# Patient Record
Sex: Female | Born: 1935 | Race: White | Hispanic: No
Health system: Southern US, Community
[De-identification: ages and names within clinical notes are randomized; demographics above are authoritative.]

---

## 2001-04-22 ENCOUNTER — Other Ambulatory Visit: Admission: RE | Admit: 2001-04-22 | Discharge: 2001-04-22 | Payer: Self-pay | Admitting: Family Medicine

## 2004-10-21 ENCOUNTER — Ambulatory Visit: Payer: Self-pay | Admitting: Internal Medicine

## 2004-10-22 ENCOUNTER — Ambulatory Visit: Payer: Self-pay | Admitting: Internal Medicine

## 2004-11-19 ENCOUNTER — Emergency Department: Payer: Self-pay | Admitting: Unknown Physician Specialty

## 2004-11-21 ENCOUNTER — Ambulatory Visit: Payer: Self-pay | Admitting: Unknown Physician Specialty

## 2007-04-06 ENCOUNTER — Ambulatory Visit: Payer: Self-pay | Admitting: Family Medicine

## 2007-11-07 ENCOUNTER — Ambulatory Visit: Payer: Self-pay | Admitting: Family Medicine

## 2009-02-13 ENCOUNTER — Ambulatory Visit: Payer: Self-pay | Admitting: Family Medicine

## 2009-09-16 ENCOUNTER — Ambulatory Visit: Payer: Self-pay | Admitting: Family Medicine

## 2009-11-02 ENCOUNTER — Emergency Department: Payer: Self-pay | Admitting: Emergency Medicine

## 2009-11-19 ENCOUNTER — Ambulatory Visit: Payer: Self-pay | Admitting: Family Medicine

## 2011-03-24 ENCOUNTER — Ambulatory Visit: Payer: Self-pay | Admitting: Ophthalmology

## 2011-03-31 ENCOUNTER — Ambulatory Visit: Payer: Self-pay | Admitting: Ophthalmology

## 2011-05-15 ENCOUNTER — Ambulatory Visit: Payer: Self-pay | Admitting: Ophthalmology

## 2011-05-27 ENCOUNTER — Ambulatory Visit: Payer: Self-pay | Admitting: Ophthalmology

## 2013-06-14 ENCOUNTER — Ambulatory Visit: Payer: Self-pay | Admitting: Otolaryngology

## 2013-06-23 ENCOUNTER — Ambulatory Visit: Payer: Self-pay | Admitting: Otolaryngology

## 2013-06-28 ENCOUNTER — Ambulatory Visit: Payer: Self-pay | Admitting: Oncology

## 2013-06-28 LAB — COMPREHENSIVE METABOLIC PANEL
Albumin: 3.1 g/dL — ABNORMAL LOW (ref 3.4–5.0)
Alkaline Phosphatase: 120 U/L (ref 50–136)
Anion Gap: 10 (ref 7–16)
Calcium, Total: 8.3 mg/dL — ABNORMAL LOW (ref 8.5–10.1)
Chloride: 103 mmol/L (ref 98–107)
Co2: 29 mmol/L (ref 21–32)
Glucose: 105 mg/dL — ABNORMAL HIGH (ref 65–99)
Osmolality: 290 (ref 275–301)
Sodium: 142 mmol/L (ref 136–145)
Total Protein: 6.9 g/dL (ref 6.4–8.2)

## 2013-06-28 LAB — CBC CANCER CENTER
Basophil #: 0.1 x10 3/mm (ref 0.0–0.1)
Eosinophil %: 0.8 %
HCT: 35.4 % (ref 35.0–47.0)
HGB: 11.8 g/dL — ABNORMAL LOW (ref 12.0–16.0)
Lymphocyte %: 11.6 %
MCH: 30 pg (ref 26.0–34.0)
Monocyte #: 0.8 x10 3/mm (ref 0.2–0.9)
Monocyte %: 8.7 %
Neutrophil %: 78 %
RBC: 3.93 10*6/uL (ref 3.80–5.20)
RDW: 13.5 % (ref 11.5–14.5)

## 2013-06-28 LAB — PROTIME-INR
INR: 0.9
Prothrombin Time: 12.4 secs (ref 11.5–14.7)

## 2013-06-28 LAB — APTT: Activated PTT: 38.3 secs — ABNORMAL HIGH (ref 23.6–35.9)

## 2013-07-03 ENCOUNTER — Ambulatory Visit: Payer: Self-pay | Admitting: Otolaryngology

## 2013-07-03 DIAGNOSIS — I10 Essential (primary) hypertension: Secondary | ICD-10-CM

## 2013-07-04 ENCOUNTER — Ambulatory Visit: Payer: Self-pay | Admitting: Otolaryngology

## 2013-07-15 ENCOUNTER — Ambulatory Visit: Payer: Self-pay | Admitting: Oncology

## 2013-07-18 LAB — CBC CANCER CENTER
Eosinophil %: 0.9 %
HCT: 34.1 % — ABNORMAL LOW (ref 35.0–47.0)
Lymphocyte #: 0.6 x10 3/mm — ABNORMAL LOW (ref 1.0–3.6)
MCHC: 31.8 g/dL — ABNORMAL LOW (ref 32.0–36.0)
Monocyte %: 8.3 %
Neutrophil %: 82.6 %
RBC: 3.8 10*6/uL (ref 3.80–5.20)
WBC: 8.3 x10 3/mm (ref 3.6–11.0)

## 2013-07-18 LAB — COMPREHENSIVE METABOLIC PANEL
Albumin: 2.5 g/dL — ABNORMAL LOW (ref 3.4–5.0)
Alkaline Phosphatase: 126 U/L (ref 50–136)
Anion Gap: 10 (ref 7–16)
BUN: 31 mg/dL — ABNORMAL HIGH (ref 7–18)
Co2: 26 mmol/L (ref 21–32)
Creatinine: 1.1 mg/dL (ref 0.60–1.30)
EGFR (African American): 53 — ABNORMAL LOW
EGFR (Non-African Amer.): 45 — ABNORMAL LOW
Potassium: 3.9 mmol/L (ref 3.5–5.1)
SGOT(AST): 23 U/L (ref 15–37)

## 2013-07-18 LAB — LACTATE DEHYDROGENASE: LDH: 308 U/L — ABNORMAL HIGH (ref 81–246)

## 2013-07-25 LAB — URINALYSIS, COMPLETE
Bilirubin,UR: NEGATIVE
Glucose,UR: NEGATIVE mg/dL (ref 0–75)
Leukocyte Esterase: NEGATIVE
Ph: 6 (ref 4.5–8.0)
RBC,UR: 17 /HPF (ref 0–5)
Specific Gravity: 1.015 (ref 1.003–1.030)
Squamous Epithelial: 1

## 2013-07-25 LAB — CBC CANCER CENTER
Bands: 16 %
Lymphocytes: 1 %
MCH: 28.2 pg (ref 26.0–34.0)
MCHC: 31.6 g/dL — ABNORMAL LOW (ref 32.0–36.0)
MCV: 89 fL (ref 80–100)
Monocytes: 8 %
Platelet: 328 x10 3/mm (ref 150–440)
RBC: 4.01 10*6/uL (ref 3.80–5.20)
RDW: 14.5 % (ref 11.5–14.5)
WBC: 40.3 x10 3/mm — ABNORMAL HIGH (ref 3.6–11.0)

## 2013-08-01 LAB — CBC CANCER CENTER
Basophil #: 0.2 x10 3/mm — ABNORMAL HIGH (ref 0.0–0.1)
HGB: 11.5 g/dL — ABNORMAL LOW (ref 12.0–16.0)
Lymphocyte #: 1.4 x10 3/mm (ref 1.0–3.6)
Lymphocyte %: 9.7 %
MCH: 28.7 pg (ref 26.0–34.0)
MCV: 90 fL (ref 80–100)
Monocyte #: 1.1 x10 3/mm — ABNORMAL HIGH (ref 0.2–0.9)
Monocyte %: 8.2 %
Neutrophil #: 11.2 x10 3/mm — ABNORMAL HIGH (ref 1.4–6.5)
Neutrophil %: 79.8 %
Platelet: 268 x10 3/mm (ref 150–440)
RDW: 15.8 % — ABNORMAL HIGH (ref 11.5–14.5)
WBC: 14.1 x10 3/mm — ABNORMAL HIGH (ref 3.6–11.0)

## 2013-08-08 LAB — CBC CANCER CENTER
Basophil #: 0.1 x10 3/mm (ref 0.0–0.1)
Eosinophil #: 0.1 x10 3/mm (ref 0.0–0.7)
HCT: 36 % (ref 35.0–47.0)
HGB: 11.5 g/dL — ABNORMAL LOW (ref 12.0–16.0)
Lymphocyte %: 15.5 %
MCH: 29.1 pg (ref 26.0–34.0)
MCHC: 32 g/dL (ref 32.0–36.0)
MCV: 91 fL (ref 80–100)
Monocyte #: 1 x10 3/mm — ABNORMAL HIGH (ref 0.2–0.9)
Platelet: 264 x10 3/mm (ref 150–440)
WBC: 8.2 x10 3/mm (ref 3.6–11.0)

## 2013-08-14 ENCOUNTER — Ambulatory Visit: Payer: Self-pay | Admitting: Oncology

## 2013-08-15 LAB — COMPREHENSIVE METABOLIC PANEL
Albumin: 3 g/dL — ABNORMAL LOW (ref 3.4–5.0)
Alkaline Phosphatase: 92 U/L
BUN: 21 mg/dL — ABNORMAL HIGH (ref 7–18)
Calcium, Total: 8.7 mg/dL (ref 8.5–10.1)
Creatinine: 0.91 mg/dL (ref 0.60–1.30)
EGFR (Non-African Amer.): 57 — ABNORMAL LOW
Glucose: 115 mg/dL — ABNORMAL HIGH (ref 65–99)
SGOT(AST): 32 U/L (ref 15–37)
Sodium: 141 mmol/L (ref 136–145)
Total Protein: 6.2 g/dL — ABNORMAL LOW (ref 6.4–8.2)

## 2013-08-15 LAB — CBC CANCER CENTER
Basophil #: 0.1 x10 3/mm (ref 0.0–0.1)
Basophil %: 1.4 %
Eosinophil #: 0.2 x10 3/mm (ref 0.0–0.7)
Eosinophil %: 3.5 %
HCT: 34.8 % — ABNORMAL LOW (ref 35.0–47.0)
Lymphocyte #: 1 x10 3/mm (ref 1.0–3.6)
Lymphocyte %: 16.5 %
MCH: 29.7 pg (ref 26.0–34.0)
MCHC: 32.7 g/dL (ref 32.0–36.0)
MCV: 91 fL (ref 80–100)
Monocyte %: 11.9 %
Neutrophil %: 66.7 %
Platelet: 192 x10 3/mm (ref 150–440)
RDW: 16.4 % — ABNORMAL HIGH (ref 11.5–14.5)

## 2013-09-12 LAB — COMPREHENSIVE METABOLIC PANEL
Albumin: 3.1 g/dL — ABNORMAL LOW (ref 3.4–5.0)
Alkaline Phosphatase: 118 U/L — ABNORMAL HIGH
BUN: 26 mg/dL — ABNORMAL HIGH (ref 7–18)
EGFR (African American): 60 — ABNORMAL LOW
EGFR (Non-African Amer.): 52 — ABNORMAL LOW
Glucose: 119 mg/dL — ABNORMAL HIGH (ref 65–99)
Osmolality: 289 (ref 275–301)
Potassium: 4.1 mmol/L (ref 3.5–5.1)
SGOT(AST): 44 U/L — ABNORMAL HIGH (ref 15–37)
SGPT (ALT): 28 U/L (ref 12–78)
Sodium: 142 mmol/L (ref 136–145)

## 2013-09-12 LAB — CBC CANCER CENTER
Basophil %: 1.4 %
Eosinophil #: 0.1 x10 3/mm (ref 0.0–0.7)
Eosinophil %: 2.4 %
HCT: 34.7 % — ABNORMAL LOW (ref 35.0–47.0)
Lymphocyte #: 0.5 x10 3/mm — ABNORMAL LOW (ref 1.0–3.6)
Lymphocyte %: 9.7 %
MCH: 29.6 pg (ref 26.0–34.0)
MCHC: 32.5 g/dL (ref 32.0–36.0)
MCV: 91 fL (ref 80–100)
Monocyte #: 0.8 x10 3/mm (ref 0.2–0.9)
Monocyte %: 14.6 %
Neutrophil #: 4 x10 3/mm (ref 1.4–6.5)
Neutrophil %: 71.9 %
Platelet: 256 x10 3/mm (ref 150–440)
RDW: 16.4 % — ABNORMAL HIGH (ref 11.5–14.5)

## 2013-09-14 ENCOUNTER — Ambulatory Visit: Payer: Self-pay | Admitting: Oncology

## 2013-10-10 LAB — CBC CANCER CENTER
Basophil #: 0.1 x10 3/mm (ref 0.0–0.1)
Basophil %: 1.8 %
Eosinophil #: 0.2 x10 3/mm (ref 0.0–0.7)
Eosinophil %: 4.6 %
HCT: 35.2 % (ref 35.0–47.0)
HGB: 11.4 g/dL — AB (ref 12.0–16.0)
Lymphocyte #: 0.4 x10 3/mm — ABNORMAL LOW (ref 1.0–3.6)
Lymphocyte %: 10.5 %
MCH: 29.4 pg (ref 26.0–34.0)
MCHC: 32.4 g/dL (ref 32.0–36.0)
MCV: 91 fL (ref 80–100)
MONOS PCT: 18.1 %
Monocyte #: 0.7 x10 3/mm (ref 0.2–0.9)
NEUTROS ABS: 2.5 x10 3/mm (ref 1.4–6.5)
Neutrophil %: 65 %
Platelet: 198 x10 3/mm (ref 150–440)
RBC: 3.88 10*6/uL (ref 3.80–5.20)
RDW: 15.1 % — AB (ref 11.5–14.5)
WBC: 3.9 x10 3/mm (ref 3.6–11.0)

## 2013-10-10 LAB — COMPREHENSIVE METABOLIC PANEL
ALT: 22 U/L (ref 12–78)
Albumin: 2.9 g/dL — ABNORMAL LOW (ref 3.4–5.0)
Alkaline Phosphatase: 123 U/L — ABNORMAL HIGH
Anion Gap: 9 (ref 7–16)
BUN: 19 mg/dL — AB (ref 7–18)
Bilirubin,Total: 0.2 mg/dL (ref 0.2–1.0)
CHLORIDE: 105 mmol/L (ref 98–107)
CREATININE: 0.95 mg/dL (ref 0.60–1.30)
Calcium, Total: 7.8 mg/dL — ABNORMAL LOW (ref 8.5–10.1)
Co2: 28 mmol/L (ref 21–32)
EGFR (African American): >60
EGFR (Non-African Amer.): 54 — ABNORMAL LOW
GLUCOSE: 159 mg/dL — AB (ref 65–99)
OSMOLALITY: 289 (ref 275–301)
Potassium: 3.7 mmol/L (ref 3.5–5.1)
SGOT(AST): 39 U/L — ABNORMAL HIGH (ref 15–37)
Sodium: 142 mmol/L (ref 136–145)
TOTAL PROTEIN: 5.8 g/dL — AB (ref 6.4–8.2)

## 2013-10-15 ENCOUNTER — Ambulatory Visit: Payer: Self-pay | Admitting: Oncology

## 2013-11-12 ENCOUNTER — Ambulatory Visit: Payer: Self-pay | Admitting: Oncology

## 2013-11-14 LAB — COMPREHENSIVE METABOLIC PANEL
Albumin: 3.2 g/dL — ABNORMAL LOW (ref 3.4–5.0)
Alkaline Phosphatase: 110 U/L
Anion Gap: 9 (ref 7–16)
BUN: 26 mg/dL — ABNORMAL HIGH (ref 7–18)
Bilirubin,Total: 0.3 mg/dL (ref 0.2–1.0)
CHLORIDE: 103 mmol/L (ref 98–107)
Calcium, Total: 8.3 mg/dL — ABNORMAL LOW (ref 8.5–10.1)
Co2: 30 mmol/L (ref 21–32)
Creatinine: 0.92 mg/dL (ref 0.60–1.30)
EGFR (Non-African Amer.): 56 — ABNORMAL LOW
Glucose: 97 mg/dL (ref 65–99)
OSMOLALITY: 288 (ref 275–301)
POTASSIUM: 3.9 mmol/L (ref 3.5–5.1)
SGOT(AST): 32 U/L (ref 15–37)
SGPT (ALT): 18 U/L (ref 12–78)
Sodium: 142 mmol/L (ref 136–145)
TOTAL PROTEIN: 6.2 g/dL — AB (ref 6.4–8.2)

## 2013-11-14 LAB — CBC CANCER CENTER
Basophil #: 0.1 x10 3/mm (ref 0.0–0.1)
Basophil %: 1 %
Eosinophil #: 0.2 x10 3/mm (ref 0.0–0.7)
Eosinophil %: 3.5 %
HCT: 36.5 % (ref 35.0–47.0)
HGB: 11.8 g/dL — AB (ref 12.0–16.0)
Lymphocyte #: 0.6 x10 3/mm — ABNORMAL LOW (ref 1.0–3.6)
Lymphocyte %: 9.7 %
MCH: 29.3 pg (ref 26.0–34.0)
MCHC: 32.5 g/dL (ref 32.0–36.0)
MCV: 90 fL (ref 80–100)
MONOS PCT: 12.6 %
Monocyte #: 0.7 x10 3/mm (ref 0.2–0.9)
NEUTROS ABS: 4.2 x10 3/mm (ref 1.4–6.5)
NEUTROS PCT: 73.2 %
Platelet: 208 x10 3/mm (ref 150–440)
RBC: 4.04 10*6/uL (ref 3.80–5.20)
RDW: 14.1 % (ref 11.5–14.5)
WBC: 5.7 x10 3/mm (ref 3.6–11.0)

## 2013-11-21 LAB — CBC CANCER CENTER
BANDS NEUTROPHIL: 19 %
COMMENT - H1-COM3: NORMAL
Eosinophil: 1 %
HCT: 34.7 % — ABNORMAL LOW (ref 35.0–47.0)
HGB: 10.9 g/dL — ABNORMAL LOW (ref 12.0–16.0)
MCH: 28.6 pg (ref 26.0–34.0)
MCHC: 31.4 g/dL — ABNORMAL LOW (ref 32.0–36.0)
MCV: 91 fL (ref 80–100)
MONOS PCT: 7 %
Platelet: 170 x10 3/mm (ref 150–440)
RBC: 3.81 10*6/uL (ref 3.80–5.20)
RDW: 14.3 % (ref 11.5–14.5)
SEGMENTED NEUTROPHILS: 73 %
WBC: 21.8 x10 3/mm — ABNORMAL HIGH (ref 3.6–11.0)

## 2013-11-28 LAB — CBC CANCER CENTER
Basophil #: 0 x10 3/mm (ref 0.0–0.1)
Basophil %: 0.5 %
EOS ABS: 0.1 x10 3/mm (ref 0.0–0.7)
Eosinophil %: 1.4 %
HCT: 35 % (ref 35.0–47.0)
HGB: 11.4 g/dL — ABNORMAL LOW (ref 12.0–16.0)
Lymphocyte #: 0.3 x10 3/mm — ABNORMAL LOW (ref 1.0–3.6)
Lymphocyte %: 2.9 %
MCH: 29.4 pg (ref 26.0–34.0)
MCHC: 32.4 g/dL (ref 32.0–36.0)
MCV: 91 fL (ref 80–100)
MONO ABS: 0.9 x10 3/mm (ref 0.2–0.9)
MONOS PCT: 9 %
NEUTROS PCT: 86.2 %
Neutrophil #: 9 x10 3/mm — ABNORMAL HIGH (ref 1.4–6.5)
PLATELETS: 267 x10 3/mm (ref 150–440)
RBC: 3.86 10*6/uL (ref 3.80–5.20)
RDW: 14.7 % — AB (ref 11.5–14.5)
WBC: 10.4 x10 3/mm (ref 3.6–11.0)

## 2013-12-05 LAB — CBC CANCER CENTER
BASOS ABS: 0.1 x10 3/mm (ref 0.0–0.1)
Basophil %: 0.8 %
Eosinophil #: 0.1 x10 3/mm (ref 0.0–0.7)
Eosinophil %: 2.1 %
HCT: 36.8 % (ref 35.0–47.0)
HGB: 11.8 g/dL — AB (ref 12.0–16.0)
LYMPHS PCT: 8.3 %
Lymphocyte #: 0.5 x10 3/mm — ABNORMAL LOW (ref 1.0–3.6)
MCH: 29.2 pg (ref 26.0–34.0)
MCHC: 32.1 g/dL (ref 32.0–36.0)
MCV: 91 fL (ref 80–100)
Monocyte #: 1.2 x10 3/mm — ABNORMAL HIGH (ref 0.2–0.9)
Monocyte %: 18.9 %
NEUTROS ABS: 4.6 x10 3/mm (ref 1.4–6.5)
Neutrophil %: 69.9 %
PLATELETS: 262 x10 3/mm (ref 150–440)
RBC: 4.06 10*6/uL (ref 3.80–5.20)
RDW: 14.9 % — ABNORMAL HIGH (ref 11.5–14.5)
WBC: 6.5 x10 3/mm (ref 3.6–11.0)

## 2013-12-12 LAB — CBC CANCER CENTER
Basophil #: 0.1 x10 3/mm (ref 0.0–0.1)
Basophil %: 1 %
EOS ABS: 0.2 x10 3/mm (ref 0.0–0.7)
Eosinophil %: 2.7 %
HCT: 35.5 % (ref 35.0–47.0)
HGB: 11.3 g/dL — ABNORMAL LOW (ref 12.0–16.0)
LYMPHS ABS: 0.8 x10 3/mm — AB (ref 1.0–3.6)
Lymphocyte %: 13.2 %
MCH: 28.9 pg (ref 26.0–34.0)
MCHC: 31.8 g/dL — AB (ref 32.0–36.0)
MCV: 91 fL (ref 80–100)
MONO ABS: 0.7 x10 3/mm (ref 0.2–0.9)
Monocyte %: 12.3 %
NEUTROS PCT: 70.8 %
Neutrophil #: 4.1 x10 3/mm (ref 1.4–6.5)
PLATELETS: 217 x10 3/mm (ref 150–440)
RBC: 3.91 10*6/uL (ref 3.80–5.20)
RDW: 14.7 % — AB (ref 11.5–14.5)
WBC: 5.7 x10 3/mm (ref 3.6–11.0)

## 2013-12-12 LAB — COMPREHENSIVE METABOLIC PANEL
ANION GAP: 10 (ref 7–16)
AST: 36 U/L (ref 15–37)
Albumin: 3.2 g/dL — ABNORMAL LOW (ref 3.4–5.0)
Alkaline Phosphatase: 129 U/L — ABNORMAL HIGH
BUN: 23 mg/dL — AB (ref 7–18)
Bilirubin,Total: 0.3 mg/dL (ref 0.2–1.0)
CHLORIDE: 106 mmol/L (ref 98–107)
Calcium, Total: 8.8 mg/dL (ref 8.5–10.1)
Co2: 28 mmol/L (ref 21–32)
Creatinine: 0.97 mg/dL (ref 0.60–1.30)
EGFR (African American): >60
EGFR (Non-African Amer.): 53 — ABNORMAL LOW
Glucose: 111 mg/dL — ABNORMAL HIGH (ref 65–99)
Osmolality: 291 (ref 275–301)
Potassium: 3.9 mmol/L (ref 3.5–5.1)
SGPT (ALT): 15 U/L (ref 12–78)
Sodium: 144 mmol/L (ref 136–145)
TOTAL PROTEIN: 6.2 g/dL — AB (ref 6.4–8.2)

## 2013-12-13 ENCOUNTER — Ambulatory Visit: Payer: Self-pay | Admitting: Oncology

## 2014-01-02 LAB — COMPREHENSIVE METABOLIC PANEL
ALBUMIN: 3.1 g/dL — AB (ref 3.4–5.0)
Alkaline Phosphatase: 176 U/L — ABNORMAL HIGH
Anion Gap: 4 — ABNORMAL LOW (ref 7–16)
BUN: 21 mg/dL — ABNORMAL HIGH (ref 7–18)
Bilirubin,Total: 0.3 mg/dL (ref 0.2–1.0)
Calcium, Total: 8.4 mg/dL — ABNORMAL LOW (ref 8.5–10.1)
Chloride: 107 mmol/L (ref 98–107)
Co2: 30 mmol/L (ref 21–32)
Creatinine: 0.8 mg/dL (ref 0.60–1.30)
EGFR (Non-African Amer.): >60
GLUCOSE: 115 mg/dL — AB (ref 65–99)
OSMOLALITY: 285 (ref 275–301)
Potassium: 3.7 mmol/L (ref 3.5–5.1)
SGOT(AST): 42 U/L — ABNORMAL HIGH (ref 15–37)
SGPT (ALT): 16 U/L (ref 12–78)
Sodium: 141 mmol/L (ref 136–145)
Total Protein: 6.3 g/dL — ABNORMAL LOW (ref 6.4–8.2)

## 2014-01-02 LAB — CBC CANCER CENTER
Basophil #: 0.1 x10 3/mm (ref 0.0–0.1)
Basophil %: 1.1 %
EOS ABS: 0.1 x10 3/mm (ref 0.0–0.7)
EOS PCT: 1.8 %
HCT: 35.3 % (ref 35.0–47.0)
HGB: 11.6 g/dL — ABNORMAL LOW (ref 12.0–16.0)
Lymphocyte #: 0.3 x10 3/mm — ABNORMAL LOW (ref 1.0–3.6)
Lymphocyte %: 5.3 %
MCH: 30.2 pg (ref 26.0–34.0)
MCHC: 32.8 g/dL (ref 32.0–36.0)
MCV: 92 fL (ref 80–100)
MONO ABS: 0.8 x10 3/mm (ref 0.2–0.9)
MONOS PCT: 13.7 %
NEUTROS ABS: 4.8 x10 3/mm (ref 1.4–6.5)
NEUTROS PCT: 78.1 %
PLATELETS: 194 x10 3/mm (ref 150–440)
RBC: 3.84 10*6/uL (ref 3.80–5.20)
RDW: 14.7 % — ABNORMAL HIGH (ref 11.5–14.5)
WBC: 6.1 x10 3/mm (ref 3.6–11.0)

## 2014-01-12 ENCOUNTER — Ambulatory Visit: Payer: Self-pay | Admitting: Oncology

## 2014-01-30 LAB — COMPREHENSIVE METABOLIC PANEL
ALBUMIN: 3.2 g/dL — AB (ref 3.4–5.0)
Alkaline Phosphatase: 165 U/L — ABNORMAL HIGH
Anion Gap: 7 (ref 7–16)
BUN: 19 mg/dL — ABNORMAL HIGH (ref 7–18)
Bilirubin,Total: 0.3 mg/dL (ref 0.2–1.0)
CREATININE: 0.91 mg/dL (ref 0.60–1.30)
Calcium, Total: 8.8 mg/dL (ref 8.5–10.1)
Chloride: 108 mmol/L — ABNORMAL HIGH (ref 98–107)
Co2: 31 mmol/L (ref 21–32)
EGFR (Non-African Amer.): 57 — ABNORMAL LOW
Glucose: 99 mg/dL (ref 65–99)
OSMOLALITY: 293 (ref 275–301)
Potassium: 4 mmol/L (ref 3.5–5.1)
SGOT(AST): 40 U/L — ABNORMAL HIGH (ref 15–37)
SGPT (ALT): 17 U/L (ref 12–78)
Sodium: 146 mmol/L — ABNORMAL HIGH (ref 136–145)
Total Protein: 6.2 g/dL — ABNORMAL LOW (ref 6.4–8.2)

## 2014-01-30 LAB — CBC CANCER CENTER
BASOS ABS: 0 x10 3/mm (ref 0.0–0.1)
BASOS PCT: 0.9 %
Eosinophil #: 0.2 x10 3/mm (ref 0.0–0.7)
Eosinophil %: 4.2 %
HCT: 32.2 % — ABNORMAL LOW (ref 35.0–47.0)
HGB: 10.8 g/dL — ABNORMAL LOW (ref 12.0–16.0)
Lymphocyte #: 0.4 x10 3/mm — ABNORMAL LOW (ref 1.0–3.6)
Lymphocyte %: 6.8 %
MCH: 30.6 pg (ref 26.0–34.0)
MCHC: 33.6 g/dL (ref 32.0–36.0)
MCV: 91 fL (ref 80–100)
Monocyte #: 0.6 x10 3/mm (ref 0.2–0.9)
Monocyte %: 12.3 %
NEUTROS ABS: 4 x10 3/mm (ref 1.4–6.5)
NEUTROS PCT: 75.8 %
Platelet: 162 x10 3/mm (ref 150–440)
RBC: 3.54 10*6/uL — AB (ref 3.80–5.20)
RDW: 15.2 % — AB (ref 11.5–14.5)
WBC: 5.3 x10 3/mm (ref 3.6–11.0)

## 2014-02-12 ENCOUNTER — Ambulatory Visit: Payer: Self-pay | Admitting: Oncology

## 2014-02-15 ENCOUNTER — Ambulatory Visit: Payer: Self-pay | Admitting: Family Medicine

## 2014-03-07 ENCOUNTER — Ambulatory Visit: Payer: Self-pay | Admitting: Oncology

## 2014-03-08 ENCOUNTER — Ambulatory Visit: Payer: Self-pay | Admitting: Oncology

## 2014-03-08 LAB — COMPREHENSIVE METABOLIC PANEL
ALBUMIN: 3.1 g/dL — AB (ref 3.4–5.0)
ANION GAP: 5 — AB (ref 7–16)
Alkaline Phosphatase: 164 U/L — ABNORMAL HIGH
BUN: 23 mg/dL — AB (ref 7–18)
Bilirubin,Total: 0.2 mg/dL (ref 0.2–1.0)
CALCIUM: 8.7 mg/dL (ref 8.5–10.1)
CHLORIDE: 105 mmol/L (ref 98–107)
CO2: 33 mmol/L — AB (ref 21–32)
Creatinine: 0.92 mg/dL (ref 0.60–1.30)
EGFR (African American): >60
EGFR (Non-African Amer.): 56 — ABNORMAL LOW
Glucose: 87 mg/dL (ref 65–99)
Osmolality: 288 (ref 275–301)
POTASSIUM: 4.1 mmol/L (ref 3.5–5.1)
SGOT(AST): 36 U/L (ref 15–37)
SGPT (ALT): 15 U/L (ref 12–78)
SODIUM: 143 mmol/L (ref 136–145)
Total Protein: 6.6 g/dL (ref 6.4–8.2)

## 2014-03-08 LAB — CBC CANCER CENTER
Basophil #: 0.1 x10 3/mm (ref 0.0–0.1)
Basophil %: 1.8 %
EOS PCT: 4.1 %
Eosinophil #: 0.2 x10 3/mm (ref 0.0–0.7)
HCT: 32.6 % — AB (ref 35.0–47.0)
HGB: 10.8 g/dL — ABNORMAL LOW (ref 12.0–16.0)
Lymphocyte #: 0.3 x10 3/mm — ABNORMAL LOW (ref 1.0–3.6)
Lymphocyte %: 7.1 %
MCH: 30.9 pg (ref 26.0–34.0)
MCHC: 33.1 g/dL (ref 32.0–36.0)
MCV: 93 fL (ref 80–100)
Monocyte #: 0.4 x10 3/mm (ref 0.2–0.9)
Monocyte %: 10.4 %
Neutrophil #: 3.3 x10 3/mm (ref 1.4–6.5)
Neutrophil %: 76.6 %
Platelet: 212 x10 3/mm (ref 150–440)
RBC: 3.49 10*6/uL — ABNORMAL LOW (ref 3.80–5.20)
RDW: 14.5 % (ref 11.5–14.5)
WBC: 4.3 x10 3/mm (ref 3.6–11.0)

## 2014-03-08 LAB — LACTATE DEHYDROGENASE: LDH: 391 U/L — AB (ref 81–246)

## 2014-03-14 ENCOUNTER — Ambulatory Visit: Payer: Self-pay | Admitting: Oncology

## 2014-03-20 ENCOUNTER — Inpatient Hospital Stay: Payer: Self-pay | Admitting: Family Medicine

## 2014-03-20 LAB — CBC
HCT: 32.1 % — AB (ref 35.0–47.0)
HGB: 10.7 g/dL — AB (ref 12.0–16.0)
MCH: 31.7 pg (ref 26.0–34.0)
MCHC: 33.2 g/dL (ref 32.0–36.0)
MCV: 95 fL (ref 80–100)
Platelet: 172 10*3/uL (ref 150–440)
RBC: 3.37 10*6/uL — AB (ref 3.80–5.20)
RDW: 14.8 % — AB (ref 11.5–14.5)
WBC: 4.9 10*3/uL (ref 3.6–11.0)

## 2014-03-20 LAB — COMPREHENSIVE METABOLIC PANEL
ALK PHOS: 139 U/L — AB
ALT: 15 U/L (ref 12–78)
ANION GAP: 7 (ref 7–16)
Albumin: 3.2 g/dL — ABNORMAL LOW (ref 3.4–5.0)
BUN: 16 mg/dL (ref 7–18)
Bilirubin,Total: 0.4 mg/dL (ref 0.2–1.0)
CO2: 28 mmol/L (ref 21–32)
CREATININE: 1.01 mg/dL (ref 0.60–1.30)
Calcium, Total: 8.6 mg/dL (ref 8.5–10.1)
Chloride: 99 mmol/L (ref 98–107)
EGFR (Non-African Amer.): 50 — ABNORMAL LOW
GFR CALC AF AMER: 58 — AB
GLUCOSE: 95 mg/dL (ref 65–99)
OSMOLALITY: 269 (ref 275–301)
Potassium: 3.7 mmol/L (ref 3.5–5.1)
SGOT(AST): 35 U/L (ref 15–37)
Sodium: 134 mmol/L — ABNORMAL LOW (ref 136–145)
TOTAL PROTEIN: 6.6 g/dL (ref 6.4–8.2)

## 2014-03-20 LAB — TROPONIN I: Troponin-I: <0.02 ng/mL

## 2014-03-21 ENCOUNTER — Ambulatory Visit: Payer: Self-pay | Admitting: Neurology

## 2014-03-21 DIAGNOSIS — I519 Heart disease, unspecified: Secondary | ICD-10-CM

## 2014-03-21 LAB — URINALYSIS, COMPLETE
BILIRUBIN, UR: NEGATIVE
GLUCOSE, UR: NEGATIVE mg/dL (ref 0–75)
Leukocyte Esterase: NEGATIVE
Nitrite: POSITIVE
Ph: 7 (ref 4.5–8.0)
Protein: NEGATIVE
RBC,UR: 5 /HPF (ref 0–5)
Specific Gravity: 1.005 (ref 1.003–1.030)
Squamous Epithelial: 1
WBC UR: 1 /HPF (ref 0–5)

## 2014-03-21 LAB — LIPID PANEL
CHOLESTEROL: 207 mg/dL — AB (ref 0–200)
HDL Cholesterol: 60 mg/dL (ref 40–60)
Ldl Cholesterol, Calc: 126 mg/dL — ABNORMAL HIGH (ref 0–100)
Triglycerides: 104 mg/dL (ref 0–200)
VLDL Cholesterol, Calc: 21 mg/dL (ref 5–40)

## 2014-03-22 LAB — BASIC METABOLIC PANEL
Anion Gap: 8 (ref 7–16)
BUN: 21 mg/dL — ABNORMAL HIGH (ref 7–18)
CREATININE: 0.98 mg/dL (ref 0.60–1.30)
Calcium, Total: 8.7 mg/dL (ref 8.5–10.1)
Chloride: 103 mmol/L (ref 98–107)
Co2: 28 mmol/L (ref 21–32)
EGFR (Non-African Amer.): 52 — ABNORMAL LOW
Glucose: 107 mg/dL — ABNORMAL HIGH (ref 65–99)
OSMOLALITY: 281 (ref 275–301)
Potassium: 3.7 mmol/L (ref 3.5–5.1)
SODIUM: 139 mmol/L (ref 136–145)

## 2014-03-23 ENCOUNTER — Ambulatory Visit: Payer: Self-pay | Admitting: Oncology

## 2014-04-10 ENCOUNTER — Inpatient Hospital Stay: Payer: Self-pay | Admitting: Oncology

## 2014-04-10 LAB — URINALYSIS, COMPLETE
Bacteria: NONE SEEN
Bilirubin,UR: NEGATIVE
Glucose,UR: NEGATIVE mg/dL (ref 0–75)
Nitrite: POSITIVE
PH: 6 (ref 4.5–8.0)
Protein: 30
RBC,UR: 6 /HPF (ref 0–5)
Specific Gravity: 1.015 (ref 1.003–1.030)
Squamous Epithelial: NONE SEEN
WBC UR: 73 /HPF (ref 0–5)

## 2014-04-10 LAB — COMPREHENSIVE METABOLIC PANEL
Albumin: 3.2 g/dL — ABNORMAL LOW (ref 3.4–5.0)
Alkaline Phosphatase: 107 U/L
Anion Gap: 9 (ref 7–16)
BUN: 26 mg/dL — AB (ref 7–18)
Bilirubin,Total: 0.6 mg/dL (ref 0.2–1.0)
CHLORIDE: 99 mmol/L (ref 98–107)
Calcium, Total: 8.8 mg/dL (ref 8.5–10.1)
Co2: 28 mmol/L (ref 21–32)
Creatinine: 0.76 mg/dL (ref 0.60–1.30)
EGFR (African American): >60
Glucose: 115 mg/dL — ABNORMAL HIGH (ref 65–99)
OSMOLALITY: 278 (ref 275–301)
Potassium: 3.5 mmol/L (ref 3.5–5.1)
SGOT(AST): 27 U/L (ref 15–37)
SGPT (ALT): 12 U/L — ABNORMAL LOW
Sodium: 136 mmol/L (ref 136–145)
Total Protein: 6.2 g/dL — ABNORMAL LOW (ref 6.4–8.2)

## 2014-04-10 LAB — CBC WITH DIFFERENTIAL/PLATELET
BASOS ABS: 0 10*3/uL (ref 0.0–0.1)
BASOS PCT: 0.5 %
Eosinophil #: 0 10*3/uL (ref 0.0–0.7)
Eosinophil %: 0.6 %
HCT: 32.2 % — ABNORMAL LOW (ref 35.0–47.0)
HGB: 10.7 g/dL — AB (ref 12.0–16.0)
Lymphocyte #: 0.2 10*3/uL — ABNORMAL LOW (ref 1.0–3.6)
Lymphocyte %: 3.9 %
MCH: 31.7 pg (ref 26.0–34.0)
MCHC: 33.1 g/dL (ref 32.0–36.0)
MCV: 96 fL (ref 80–100)
MONO ABS: 0.6 x10 3/mm (ref 0.2–0.9)
MONOS PCT: 11 %
Neutrophil #: 4.3 10*3/uL (ref 1.4–6.5)
Neutrophil %: 84 %
Platelet: 186 10*3/uL (ref 150–440)
RBC: 3.36 10*6/uL — AB (ref 3.80–5.20)
RDW: 13.9 % (ref 11.5–14.5)
WBC: 5.1 10*3/uL (ref 3.6–11.0)

## 2014-04-11 LAB — COMPREHENSIVE METABOLIC PANEL
Albumin: 2.9 g/dL — ABNORMAL LOW (ref 3.4–5.0)
Alkaline Phosphatase: 101 U/L
Anion Gap: 7 (ref 7–16)
BILIRUBIN TOTAL: 0.4 mg/dL (ref 0.2–1.0)
BUN: 30 mg/dL — ABNORMAL HIGH (ref 7–18)
Calcium, Total: 8.1 mg/dL — ABNORMAL LOW (ref 8.5–10.1)
Chloride: 103 mmol/L (ref 98–107)
Co2: 30 mmol/L (ref 21–32)
Creatinine: 0.85 mg/dL (ref 0.60–1.30)
EGFR (African American): >60
EGFR (Non-African Amer.): >60
Glucose: 145 mg/dL — ABNORMAL HIGH (ref 65–99)
Osmolality: 288 (ref 275–301)
POTASSIUM: 3.4 mmol/L — AB (ref 3.5–5.1)
SGOT(AST): 26 U/L (ref 15–37)
SGPT (ALT): 12 U/L — ABNORMAL LOW
SODIUM: 140 mmol/L (ref 136–145)
Total Protein: 6.1 g/dL — ABNORMAL LOW (ref 6.4–8.2)

## 2014-04-11 LAB — CBC WITH DIFFERENTIAL/PLATELET
BASOS PCT: 0.3 %
Basophil #: 0 10*3/uL (ref 0.0–0.1)
EOS ABS: 0 10*3/uL (ref 0.0–0.7)
Eosinophil %: 0 %
HCT: 32.1 % — ABNORMAL LOW (ref 35.0–47.0)
HGB: 10.7 g/dL — ABNORMAL LOW (ref 12.0–16.0)
LYMPHS ABS: 0.2 10*3/uL — AB (ref 1.0–3.6)
Lymphocyte %: 4.6 %
MCH: 31.9 pg (ref 26.0–34.0)
MCHC: 33.4 g/dL (ref 32.0–36.0)
MCV: 96 fL (ref 80–100)
Monocyte #: 0.1 x10 3/mm — ABNORMAL LOW (ref 0.2–0.9)
Monocyte %: 1.7 %
Neutrophil #: 4.4 10*3/uL (ref 1.4–6.5)
Neutrophil %: 93.4 %
PLATELETS: 176 10*3/uL (ref 150–440)
RBC: 3.36 10*6/uL — ABNORMAL LOW (ref 3.80–5.20)
RDW: 14.1 % (ref 11.5–14.5)
WBC: 4.7 10*3/uL (ref 3.6–11.0)

## 2014-04-11 LAB — MAGNESIUM: Magnesium: 1 mg/dL — ABNORMAL LOW

## 2014-04-12 LAB — BASIC METABOLIC PANEL
ANION GAP: 8 (ref 7–16)
BUN: 37 mg/dL — AB (ref 7–18)
CO2: 26 mmol/L (ref 21–32)
CREATININE: 1.06 mg/dL (ref 0.60–1.30)
Calcium, Total: 8.3 mg/dL — ABNORMAL LOW (ref 8.5–10.1)
Chloride: 105 mmol/L (ref 98–107)
EGFR (African American): 55 — ABNORMAL LOW
EGFR (Non-African Amer.): 48 — ABNORMAL LOW
Glucose: 158 mg/dL — ABNORMAL HIGH (ref 65–99)
OSMOLALITY: 290 (ref 275–301)
Potassium: 3.7 mmol/L (ref 3.5–5.1)
SODIUM: 139 mmol/L (ref 136–145)

## 2014-04-12 LAB — MAGNESIUM: Magnesium: 2.7 mg/dL — ABNORMAL HIGH

## 2014-04-14 ENCOUNTER — Ambulatory Visit: Payer: Self-pay | Admitting: Oncology

## 2014-04-16 ENCOUNTER — Ambulatory Visit: Payer: Self-pay | Admitting: Oncology

## 2014-05-02 ENCOUNTER — Observation Stay: Payer: Self-pay | Admitting: Internal Medicine

## 2014-05-02 LAB — CBC WITH DIFFERENTIAL/PLATELET
BASOS ABS: 0.1 10*3/uL (ref 0.0–0.1)
BASOS PCT: 1 %
EOS ABS: 0 10*3/uL (ref 0.0–0.7)
Eosinophil %: 0.4 %
HCT: 36 % (ref 35.0–47.0)
HGB: 12.3 g/dL (ref 12.0–16.0)
LYMPHS PCT: 1.4 %
Lymphocyte #: 0.1 10*3/uL — ABNORMAL LOW (ref 1.0–3.6)
MCH: 32.6 pg (ref 26.0–34.0)
MCHC: 34.1 g/dL (ref 32.0–36.0)
MCV: 96 fL (ref 80–100)
Monocyte #: 0.5 x10 3/mm (ref 0.2–0.9)
Monocyte %: 5.9 %
NEUTROS ABS: 8.5 10*3/uL — AB (ref 1.4–6.5)
Neutrophil %: 91.3 %
Platelet: 153 10*3/uL (ref 150–440)
RBC: 3.76 10*6/uL — AB (ref 3.80–5.20)
RDW: 14.5 % (ref 11.5–14.5)
WBC: 9 10*3/uL (ref 3.6–11.0)

## 2014-05-02 LAB — COMPREHENSIVE METABOLIC PANEL
ALBUMIN: 2.5 g/dL — AB (ref 3.4–5.0)
Alkaline Phosphatase: 120 U/L — ABNORMAL HIGH
Anion Gap: 7 (ref 7–16)
BUN: 46 mg/dL — ABNORMAL HIGH (ref 7–18)
Bilirubin,Total: 0.4 mg/dL (ref 0.2–1.0)
CO2: 27 mmol/L (ref 21–32)
Calcium, Total: 8 mg/dL — ABNORMAL LOW (ref 8.5–10.1)
Chloride: 101 mmol/L (ref 98–107)
Creatinine: 1.18 mg/dL (ref 0.60–1.30)
EGFR (African American): 48 — ABNORMAL LOW
EGFR (Non-African Amer.): 42 — ABNORMAL LOW
GLUCOSE: 181 mg/dL — AB (ref 65–99)
OSMOLALITY: 287 (ref 275–301)
Potassium: 5 mmol/L (ref 3.5–5.1)
SGOT(AST): 27 U/L (ref 15–37)
SGPT (ALT): 24 U/L
Sodium: 135 mmol/L — ABNORMAL LOW (ref 136–145)
TOTAL PROTEIN: 5.6 g/dL — AB (ref 6.4–8.2)

## 2014-05-02 LAB — CK TOTAL AND CKMB (NOT AT ARMC): CK, TOTAL: 69 U/L

## 2014-05-02 LAB — URINALYSIS, COMPLETE
Bilirubin,UR: NEGATIVE
Glucose,UR: NEGATIVE mg/dL (ref 0–75)
KETONE: NEGATIVE
Nitrite: POSITIVE
Ph: 6 (ref 4.5–8.0)
Protein: 30
SQUAMOUS EPITHELIAL: NONE SEEN
Specific Gravity: 1.016 (ref 1.003–1.030)

## 2014-05-02 LAB — TROPONIN I

## 2014-05-02 LAB — TSH: Thyroid Stimulating Horm: 3.41 u[IU]/mL

## 2014-05-04 LAB — URINE CULTURE

## 2014-05-15 ENCOUNTER — Ambulatory Visit: Payer: Self-pay | Admitting: Oncology

## 2014-06-14 DEATH — deceased

## 2014-09-07 IMAGING — CR DG CHEST 1V PORT
1 series · 1 of 1 positions shown · non-contrast
Comparison: none

REASON FOR EXAM: f/u port a cath placement
COMMENTS:

[ap]
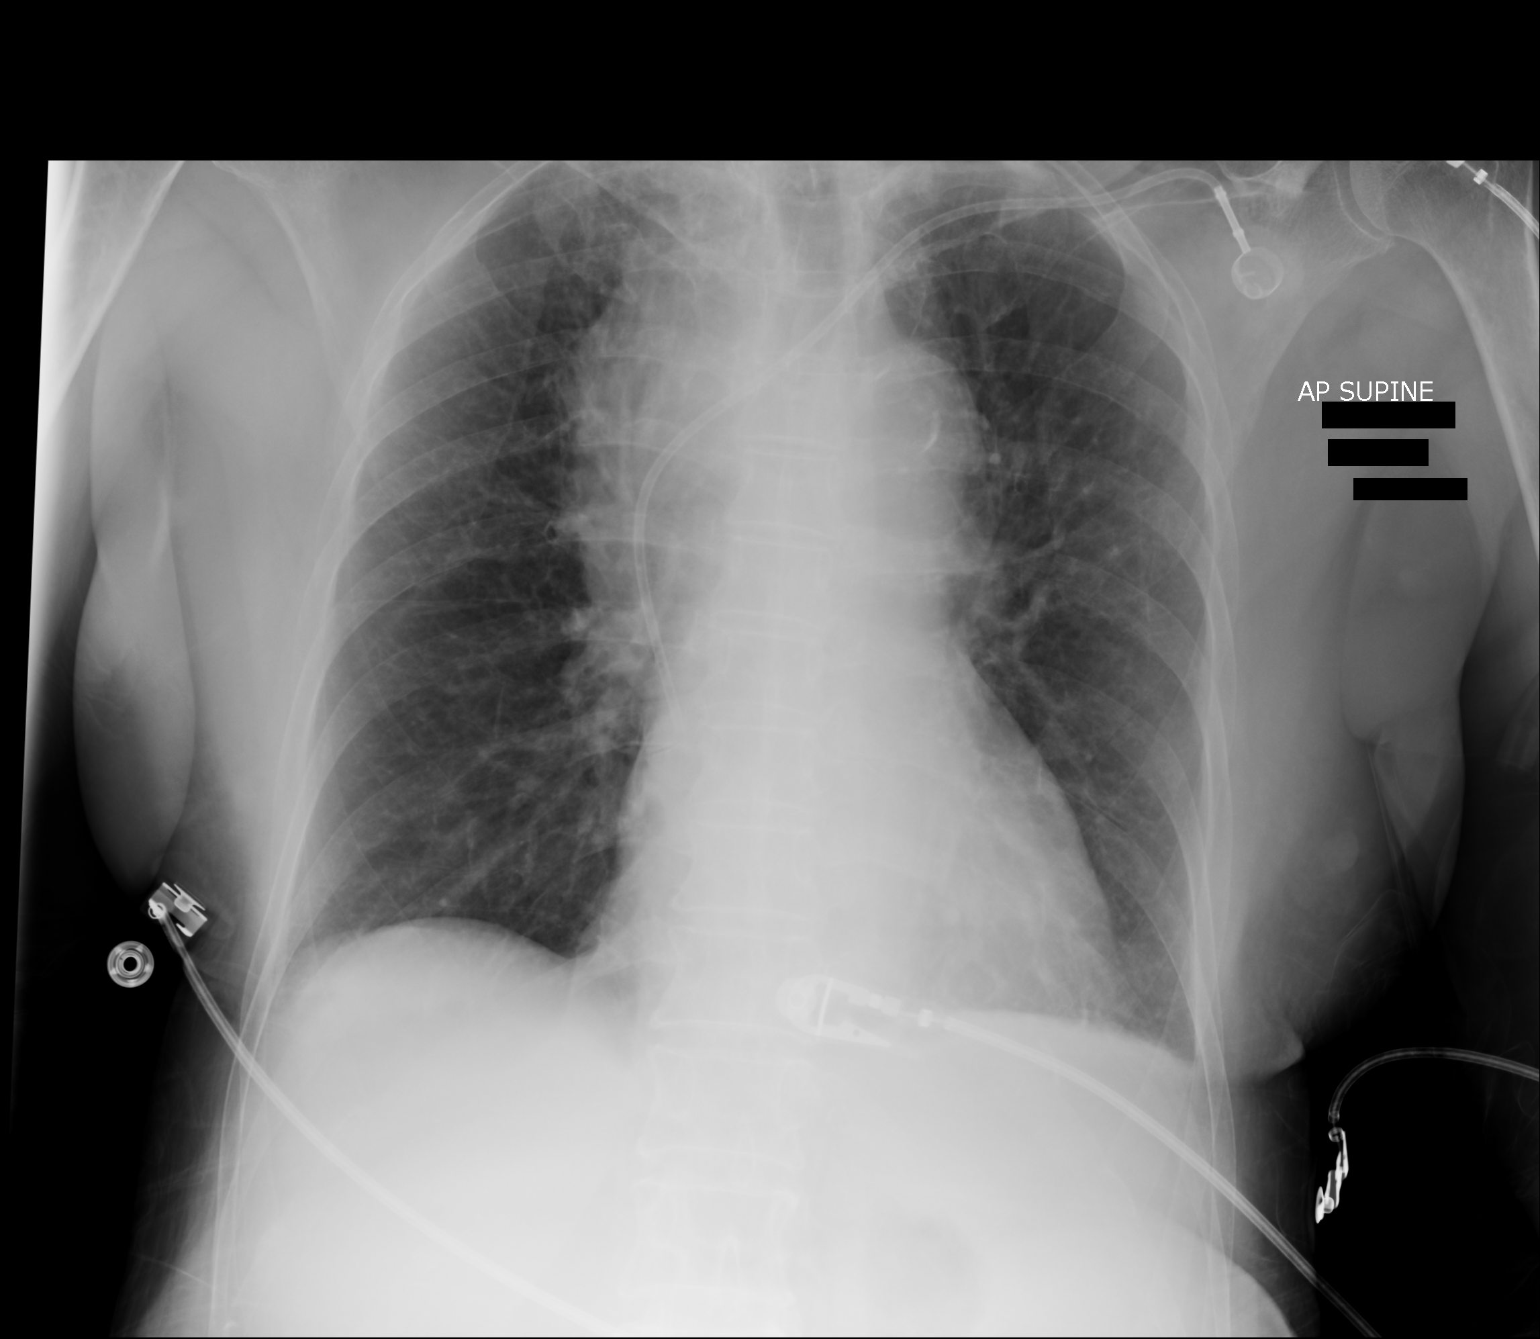

[1 of 1 positions shown; findings below may reference images not displayed]

PROCEDURE:     DXR - DXR PORTABLE CHEST SINGLE VIEW  - July 04, 2013  [DATE]

RESULT:     There is widening of the mediastinum consistent with the mass
seen previously in the superior mediastinum on the previous CT scan. There
is a Port-A-Cath device present over the left chest with the tip of the
catheter in the superior vena cava. There is no pneumothorax.
Atherosclerotic calcification is noted. The heart size is normal.
IMPRESSION: Placement of a Port-A-Cath device without evidence of
complication.

[REDACTED]

## 2015-01-04 NOTE — Op Note (Signed)
PATIENT NAME:  Jane Cross, Jane Cross MR#:  [id number] DATE OF BIRTH:  1925/06/03  DATE OF PROCEDURE:  07/04/2013  PREOPERATIVE DIAGNOSIS: Probable lymphoma.   POSTOPERATIVE DIAGNOSIS: Probable lymphoma.  PROCEDURE PERFORMED: Left subclavian vein Port-A-Cath.   SURGEON: Ida Roguehristopher Elieser Tetrick, MD.  ESTIMATED BLOOD LOSS: 15 mL.   ANESTHESIA: General.   COMPLICATIONS: None.   SPECIMENS: None.   INDICATION FOR SURGERY: Jane Cross is a pleasant 81 year old female, who presents with a large mediastinal mass. She had a supraclavicular lymph node, which was biopsied and returned as a lymphoma and we, thus, proceeded to place a Port-A-Cath.   DETAILS OF PROCEDURE: Informed consent was obtained. Dr. Andee PolesVaught performed his part of the surgery, which will be dictated elsewhere. Then, her bilateral chest was prepped and draped in a standard surgical fashion. A timeout was then performed correctly identifying the patient name, operative site and procedure to be performed. A single stick was used to access the left subclavian vein under the clavicle. A wire was then placed through the needle. The needle was withdrawn and fluoroscopy was used to show that the catheter was going in the correct position. I then fashioned a port pocket on the patient's chest. I then sutured the port in place with interrupted 3-0 Prolene sutures. I then proceeded to place the dilator and later the dilator and sheath combo over the wire under fluoroscopy, which showed wire in the correct position. The dilator and wire were then withdrawn. The catheter was then placed through the sheath and initially I tried to proceed towards presumably to the right subclavian vein. It was not able to be passed in the correct direction and therefore I did place the wire through the catheter, which provided some firmness and I was able to place the catheter to the atriocaval junction. The wire was then withdrawn. The catheter was then cut so that it  would connect to the port and then the port was secured to the chest wall using the previously placed sutures. The port was accessed and noted to be draw and flow easily. The skin was then closed with interrupted 3-0 Vicryl and 4-0 Monocryl subcuticular. Steri-Strips, Telfa gauze and Tegaderm were then used to complete the dressing. The patient was then awoken, extubated, and brought to the postanesthesia care unit. There were no immediate complications. Needle, sponge, and instrument counts were correct at the end of the procedure. The catheter appeared to be in appropriate position without any pneumothorax postoperatively.   ____________________________ Si Raiderhristopher A. Rohini Jaroszewski, MD cal:aw D: 07/04/2013 13:16:43 ET T: 07/04/2013 14:06:06 ET JOB#: [id number]  cc: Cristal Deerhristopher A. Isaiahs Chancy, MD, <Dictator> Jarvis NewcomerHRISTOPHER A Brayant Dorr MD ELECTRONICALLY SIGNED 07/06/2013 9:16

## 2015-01-04 NOTE — Op Note (Signed)
PATIENT NAME:  Jane PizzaWHITESELL, Dondi W MR#:  [id number] DATE OF BIRTH:  11/20/1924  DATE OF PROCEDURE:  07/04/2013  PREOPERATIVE DIAGNOSIS: Right supraclavicular mass.   PROCEDURE PERFORMED: Excision of a deep right supraclavicular mass/lymph node.   SURGEON: Bud Facereighton Santhosh Gulino, M.D.   ANESTHESIA: General endotracheal anesthesia.   ESTIMATED BLOOD LOSS: Less than 5 mL.   IV FLUIDS: Please see anesthesia record.   COMPLICATIONS: None.   DRAINS/STENT PLACEMENTS: None.   SPECIMENS: Right supraclavicular neck mass.   ASSISTANT: Dr. Linus Salmonshapman McQueen.   DESCRIPTION OF PROCEDURE: After the patient was identified in holding, benefits and risks of the procedure were discussed and consent was reviewed, the patient was taken to the operating room and placed in the supine position. General endotracheal anesthesia was induced. The patient was rotated 90 degrees. A previously marked anterior neck incision was prepped and draped in sterile fashion after injection of 4 mL of 1% lidocaine with 1:100,000 epinephrine. The patient's right neck incision was made with 15 blade scalpel. Dissection was carried down through the platysma fat and a combination of blunt and sharp techniques were used to divide the subplatysmal tissue. The anterior jugular vein was identified and coursed down beneath the patient's clavicle. A right supraclavicular neck mass extending down into the patient's inferior clavicular area was identified. This was bluntly dissected away from the underlying musculature as well as underlying vasculature. This was circumferentially dissected and the attachments were divided using a Harmonic scalpel. At this time, the supraclavicular neck mass was sent down for fresh evaluation for lymphoma and this was highly suspicious for lymphoma. At this time, care of the patient was transferred to Dr. Sharmon RevereLindquist for placement of a Port-A-Cath placement for anticipation of chemotherapy.    ____________________________ Kyung Ruddreighton C. Catalino Plascencia, MD ccv:aw D: 07/04/2013 12:31:42 ET T: 07/04/2013 12:38:33 ET JOB#: [id number]  cc: Kyung Ruddreighton C. Lota Leamer, MD, <Dictator> Kyung RuddREIGHTON C Kissie Ziolkowski MD ELECTRONICALLY SIGNED 07/07/2013 13:27

## 2015-01-05 NOTE — Consult Note (Signed)
History of Present Illness:  Reason for Consult patient. was admitted in the hospital with abnormal behavior.  Confusion disorientation. 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  During evaluation patient has been found lethargic.  And also had UA tract infection.  I was asked to evaluate patient for further treatment consideration   HPI   Chief Complaint/Diagnosis:   1. Abnormal CT scan with necrotic mediastinal mass and splenic mass and possible adenopathy and upper abdominal area(June 23, 2013)Biopsy from Right supraclavicular lymph node is positive for diffuse large cell lymphoma.  Diagnosis in October of 2014Patient is starting bendamustine and Rituxan from July 18, 2013. Patient has finished total of 8 cycles of chemotherapy with  BENDAMUSTINE  AND RITUXAMAB.  On May of 2015.scan shows improvement but still persistent significant disease  PFSH:  Additional Past Medical and Surgical History been reviewed from previous multiple notes   Review of Systems:  General weakness  fatigue   Performance Status (ECOG) 3   HEENT no complaints   Lungs cough  SOB   Cardiac no complaints   GI no complaints   GU no complaints   Musculoskeletal back pain   Extremities swelling   Skin no complaints   Psych anxiety  depression   Review of Systems   No code.has a living willeurological system: Disorientation confusion.  NURSING NOTES: **Vital Signs.:   20-Aug-15 05:18   Vital Signs Type: Routine   Temperature Temperature (F): 97.6   Celsius: 36.4   Temperature Source: oral   Pulse Pulse: 82   Respirations Respirations: 20   Systolic BP Systolic BP: 585   Diastolic BP (mmHg) Diastolic BP (mmHg): 69   Mean BP: 81   Pulse Ox % Pulse Ox %: 96   Pulse Ox Activity Level: At rest   Oxygen Delivery: Room Air/ 21 %   Physical Exam:  General patient  is lying in the bed very  lethargic.  Daughter is present   Lungs: rhonchi   Cardiac: tachycardia   Breast: not examined   Abdomen: soft  nontender   Skin: intact   Extremities: edema   Physical Exam patient is very confused lethargic.  No focal signs   cranial nerces  are intact are intact     dysphagia: vocal cord dysfunction, per pt   thyroid ca:    osteoporosis:    arthritis:    GERD:    depression:    anxiety:    kidney stones:    cataracts:    skin cancer:    Hypercholesterolemia:    Hypertension:    Other -Explain in Comment: N/V/Diarrhea    oxyCODONE 20 mg/mL oral concentrate: 5 milliliter(s) orally every 1 to 2 hours, As Needed, Status: Active, Quantity: 600, Refills: None   morphine 20 mg/mL oral concentrate: 5 milliliter(s) orally every 1 to 2 hours, Status: Active, Quantity: 600, Refills: None   LORazepam 0.5 mg oral tablet: 1-2 tab(s) orally/sublingual every 2 to 4 hours as needed for agitation/anxiety, Status: Active, Quantity: 15, Refills: None   Zofran ODT 4 mg oral tablet, disintegrating: 1 tab(s) orally every 6 hours, As Needed - for Nausea, Vomiting, Status: Active, Quantity: 28, Refills: None   Decadron: 4 milligram(s) orally 2 times a day, Status: Active, Quantity: 0, Refills: None   Cipro 500 mg oral tablet: 1 tab(s) orally every 12 hours, Status: Active, Quantity: 0, Refills: None   Keppra 500 mg oral tablet: 1 tab(s) orally  2 times a day, Status: Active, Quantity: 0, Refills: None  Laboratory Results:  Thyroid:  19-Aug-15 12:28   Thyroid Stimulating Hormone 3.41 (0.45-4.50 (International Unit)  ----------------------- Pregnant patients have  different reference  ranges for TSH:  - - - - - - - - - -  Pregnant, first trimetser:  0.36 - 2.50 uIU/mL)  Hepatic:  19-Aug-15 00:28   Bilirubin, Total 0.4  Alkaline Phosphatase  120 (46-116 NOTE: New Reference Range 04/03/14)  SGPT (ALT) 24 (14-63 NOTE: New Reference Range 04/03/14)  SGOT (AST) 27   Total Protein, Serum  5.6  Albumin, Serum  2.5  Routine Micro:  19-Aug-15 00:28   Organism Name [place] ROD  Organism Quantity >100,000 CFU/ML  Micro Text Report URINE CULTURE   ORGANISM 1                >100,000 CFU/ML GRAM NEGATIVE ROD   COMMENT                   ID TO FOLLOW SENSITIVITIES TO FOLLOW   ANTIBIOTIC                       Specimen Source IN AND OUT CATH  Organism 1 >100,000 CFU/ML GRAM NEGATIVE ROD  Culture Comment ID TO FOLLOW SENSITIVITIES TO FOLLOW  Result(s) reported on 03 May 2014 at 09:52AM.  Cardiology:  19-Aug-15 00:16   Ventricular Rate 86  Atrial Rate 86  P-R Interval 142  QRS Duration 82  QT 358  QTc 428  P Axis 48  R Axis 2  T Axis 57  ECG interpretation Normal sinus rhythm Normal ECG When compared with ECG of 10-Apr-2014 12:52, No significant change was found ----------unconfirmed---------- Confirmed by OVERREAD, NOT ([id number]), editor PEARSON, BARBARA ([id number]) on 05/02/2014 8:30:06 AM  Routine Chem:  19-Aug-15 00:28   Glucose, Serum  181  BUN  46  Creatinine (comp) 1.18  Sodium, Serum  135  Potassium, Serum 5.0  Chloride, Serum 101  CO2, Serum 27  Calcium (Total), Serum  8.0  Osmolality (calc) 287  eGFR (African American)  48  eGFR (Non-African American)  42 (eGFR values <18mL/min/1.73 m2 may be an indication of chronic kidney disease (CKD). Calculated eGFR is useful in patients with stable renal function. The eGFR calculation will not be reliable in acutely ill patients when serum creatinine is changing rapidly. It is not useful in  patients on dialysis. The eGFR calculation may not be applicable to patients at the low and high extremes of body sizes, pregnant women, and vegetarians.)  Result Comment POTASSIUM/CREATININE - Slight hemolysis, interpret results with AST/CKI - caution.  Result(s) reported on 02 May 2014 at 12:53AM.  Anion Gap 7  Cardiac:  19-Aug-15 00:28   CK, Total 69 (26-192 NOTE: NEW REFERENCE RANGE  10/16/2013)   CPK-MB, Serum  < 0.5 (Result(s) reported on 02 May 2014 at 01:03AM.)  Troponin I < 0.02 (0.00-0.05 0.05 ng/mL or less: NEGATIVE  Repeat testing in 3-6 hrs  if clinically indicated. >0.05 ng/mL: POTENTIAL  MYOCARDIAL INJURY. Repeat  testing in 3-6 hrs if  clinically indicated. NOTE: An increase or decrease  of 30% or more on serial  testing suggests a  clinically important change)  Routine UA:  19-Aug-15 00:28   Color (UA) Yellow  Clarity (UA) Hazy  Glucose (UA) Negative  Bilirubin (UA) Negative  Ketones (UA) Negative  Specific Gravity (UA) 1.016  Blood (UA) 1+  pH (UA) 6.0  Protein (UA)  30 mg/dL  Nitrite (UA) Positive  Leukocyte Esterase (UA) 3+ (Result(s) reported on 02 May 2014 at 01:09AM.)  RBC (UA) 9 /HPF  WBC (UA) 269 /HPF  Bacteria (UA) TRACE  Epithelial Cells (UA) NONE SEEN  Result(s) reported on 02 May 2014 at 01:09AM.  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  discuss the situation with palliative care team, family, as well as hospice nurse Plan:   overall patient has very poor prognosis and her condition has been declining with increasing confusion disorientation.tract infection may be contradicting to itdo not believe patient can be taken At home at present time.hospice home placement treatment patient symptomatically with Decadron and Cipro while waiting for culturewill follow this patient in hospice home regarding progress  Electronic Signatures: Jobe Gibbon (MD)  (Signed 20-Aug-15  13:50)  Authored: HISTORY OF PRESENT ILLNESS, PFSH, ROS, NURSING NOTES, PE, PAST MEDICAL HISTORY, ALLERGIES, HOME MEDICATIONS, LABS, ASSESSMENT AND PLAN   Last Updated: 20-Aug-15 13:50 by Jobe Gibbon (MD)

## 2015-01-05 NOTE — H&P (Signed)
PATIENT NAME:  Jane PizzaWHITESELL, Amelita W MR#:  [id number] DATE OF BIRTH:  [birth year]  DATE OF ADMISSION:  03/20/2014  PRIMARY CARE PROVIDER:  Dr. Sullivan LoneGilbert   ENT:  Dr. Andee PolesVaught  PRIMARY ONCOLOGIST: Dr. Doylene Canninghoksi  CHIEF COMPLAINT: Dysarthria.   HISTORY OF PRESENTING ILLNESS: An 81 year old Caucasian female patient with history of diffuse large cell lymphoma, hypertension, hyperlipidemia, vocal cord paralysis, presents to the Emergency Room sent in from ENT. Dr. Kirt BoysWatt's office for concern of stroke. The patient's daughter has noticed that patient is having dysarthria, unable to talk for 2 days no.  Went to the ENT doctor's office for followup on the vocal cord paralysis and was referred to the Emergency Room here. Patient has been found to have a large left temporoparietal area of stroke versus mass, and with persistent symptoms, he is being admitted to the hospitalist service.   The patient does not have any other neurological  physical deficits at this point. No history of stroke.   PAST MEDICAL HISTORY:  1. Thyroid carcinoma.  2. Osteoporosis.  3. Arthritis.  4. GERD.  5. Depression.  6. Anxiety.  7. Nephrolithiasis.  8. Cataracts.  9. Skin cancer.  10. Hypercholesterolemia.  11. Hypertension.  12. Diffuse large cell lymphoma.  13. Vocal cord paralysis.   SOCIAL HISTORY: The patient does not smoke. No alcohol. No illicit drugs. Ambulates on her own.   CODE STATUS: Full code.   FAMILY HISTORY:  No family history of colorectal breast or ovarian cancers.   REVIEW OF SYSTEMS: Unobtainable secondary to patient's dysarthria.   HOME MEDICATIONS:  1. Gabapentin 100 mg 2 capsules oral 2 times a day.  2. Zofran 4 mg oral every 4 hours as needed.  3. Meclizine 25 mg oral once a day.  4. Hydrochlorothiazide 25 mg oral once a day.  5. Lisinopril 20 mg oral once a day.  6. Levothyroxine 25 mcg daily.  7. Eucerin topical cream to affected area as needed.  8. Dexilant 60 mg daily.  9. Fluticasone  nasal spray twice a day.  10. Ultram 50 mg 1-2 tablets every 4 hours as needed for pain.  11. Folic acid 0.8 mg daily.  12. Vitamin B12 at 100 mcg daily.  13. Pravastatin 20 mg daily.  14. Magnesium oxide 400 mg oral 2 times a day.  15. Vitamin D 3000 international units oral once a day.   ALLERGIES: No known drug allergies.   PHYSICAL EXAMINATION:  VITAL SIGNS: Temperature 98, pulse 98, blood pressure 182/77, saturating 98% on room air.  GENERAL: Moderately built Caucasian female patient sitting in a wheelchair, calm.  PSYCHIATRIC: Alert, awake, unknown about orientation.  HEENT:  Head normocephalic. Oral mucosa moist and pink. External ears and nose normal. No oral ulcers or thrush. No facial droop.  NECK: Supple. No thyromegaly or palpable lymph nodes. Trachea midline. No carotid bruit, JVD.  CARDIOVASCULAR: S1 and S2 without any murmurs. Peripheral pulses 2+. No edema.   RESPIRATORY: Normal work of breathing. Clear to auscultation on both sides.  GASTROINTESTINAL: Soft abdomen, nontender. Bowel sounds present. No organomegaly palpable.  GENITOURINARY: No CVA tenderness or bladder distention.  SKIN: Warm and dry. No petechiae, rash, ulcers.  MUSCULOSKELETAL: No joint swelling, redness in large joints. Normal muscle tone.  NEUROLOGICAL: Motor strength 5/5 in upper extremities. Sensation is intact all over. Gait normal. Patient has both receptive and expressive aphasia.  LYMPHATIC: No cervical lymphadenopathy.   LABORATORY STUDIES:  Show glucose of 95, BUN 16, creatinine 1.01, sodium 134, potassium  3.7. AST, ALT, alkaline phosphatase, bilirubin normal. Troponin less than 0.02.   WBC 4.9, hemoglobin 10.7, platelets of 172,000.   DIAGNOSTIC DATA:  Chest x-ray, portable, showed stable findings related to lymphoma with interval improved aeration.   CT scan of the head without contrast shows large left temporoparietal stroke versus mass. No herniation or midline shift. No hydrocephalus.    EKG shows normal sinus rhythm with septal Q waves.   ASSESSMENT AND PLAN:  1. Acute dysarthria in a patient with CT findings of large left temporoparietal stroke versus mass. We will treat this as a stroke at this point and give her aspirin, statin. Put her on a telemetry floor. Get MRI of the brain with and without contrast. Get echo and carotid Dopplers. Also consult neurology for their input with the case. There seems to be some mild edema around this area and we will give her a dose of Decadron in case this is an extension of the lymphoma. If there is no mass on MRI, the steroids should be stopped. We will have speech therapy see the patient.  2. Hypertension. We will let her run high with permissive hypertension for acute findings on CT scan. Hold the lisinopril hydrochlorothiazide. Use IV medications if blood pressure greater than 200.  3. Anemia of chronic disease, stable.  4. Hypothyroidism. Continue medication.  5. Deep vein thrombosis prophylaxis with Lovenox.   CODE STATUS: Full code.   TIME SPENT ON THIS CASE: 45 minutes.   ____________________________ Molinda Bailiff Malvina Schadler, MD srs:dd D: 03/20/2014 19:13:00 ET T: 03/20/2014 19:37:41 ET JOB#: [id number]  cc: Wardell Heath R. Marilyne Haseley, MD, <Dictator> Janak K. Choksi, MD Richard L. Sullivan Lone, MD Dr. Andee Poles Orie Fisherman MD ELECTRONICALLY SIGNED 03/27/2014 18:16

## 2015-01-05 NOTE — H&P (Signed)
PATIENT NAME:  Jane Cross, Jane Cross MR#:  [id number] DATE OF BIRTH:  1924/09/26  DATE OF ADMISSION:  05/02/2014  Addendum to Job [id number]  Add to history and physical the patient's medications, which include: 1.   Xanax 0.25 mg 1 tablet p.o. b.i.d. as needed.  2.  Vitamin D3 at 1000 international units 1 capsule p.o. daily.  3.  Vitamin B12 at 100 mcg 1 tablet p.o. daily.  4.  PreserVision AREDS antioxidant multiple vitamins and minerals 1 capsule p.o. daily.  5.  Paroxetine 20 mg 1 tablet p.o. daily.  6.  Omeprazole 1 tablet p.o. daily.  7.  Norco 325 mg/5 mg 1 tablet p.o. every 4 hours as needed for pain.  8.  Magnesium oxide 400 mg 1 tablet p.o. b.i.d.  9.  Lisinopril 20 mg 1 tablet p.o. daily.  10.  Levothyroxine 25 mcg 1 tablet p.o. daily.  11.  Levetiracetam (Keppra) 500 mg 1 tablet p.o. b.i.d.  12.  Hydrochlorothiazide 25 mg 1 tablet p.o. daily.  13.  GenTeal preserved ophthalmic solution 1 drop to the affected eye twice a day as needed.  14.  Folic acid 0.8 mg tablet 1 tablet p.o. daily.  15.  Fluticasone nasal spray 50 mcg per inhalation 1 spray to each nostril twice a day.   ALLERGIES: Oysters.   ____________________________ Kelton PillarMichael S. Sheryle Hailiamond, MD msd:DT D: 05/02/2014 08:09:07 ET T: 05/02/2014 10:13:31 ET JOB#: [id number]  cc: Kelton PillarMichael S. Sheryle Hailiamond, MD, <Dictator> Kelton PillarMICHAEL S Adem Costlow MD ELECTRONICALLY SIGNED 05/15/2014 0:10

## 2015-01-05 NOTE — Discharge Summary (Signed)
PATIENT NAME:  Jane Cross, Jane W MR#:  [id number] DATE OF BIRTH:  Nov 16, 1924  DATE OF ADMISSION:  05/02/2014 DATE OF DISCHARGE:  05/03/2014  DISCHARGE DIAGNOSES: Non-Hodgkin's lymphoma with progressive metastatic disease.   SECONDARY DIAGNOSES:  1. Hyperlipidemia. 2. Hypertension. 3. Vocal cord paralysis.  4. History of thyroid cancer. 5. Osteoporosis. 6. Osteoarthritis,  7. Gastroesophageal reflux disease.  8. Depression.  9. Anxiety. 10. Nephrolithiasis. 11. Cataract.   CONSULTATIONS:  1. Palliative care.  2. Oncology, Dr. Doylene Canninghoksi.   PROCEDURES AND RADIOLOGY: Chest x-ray on 19th of August showed new pulmonary nodules suggestive of metastatic disease. Stable right paratracheal/mediastinal mass.   Urinalysis on admission showed 269 WBCs, 3+ leukocyte esterase, positive nitrites. Trace bacteria.   Urine culture grew more than 100,000 colonies of gram-negative rod.   HISTORY AND SHORT HOSPITAL COURSE: The patient is an 81 year old female with the above-mentioned medical problems who was admitted for urinary tract infection, altered mental status and progressive metastatic lymphoma. Please see Dr. Casimiro NeedleMichael Diamond's dictated history and physical for further details. Palliative care consultation was obtained with Dr. Harriett SineNancy Phifer where a long discussion with family and decision was made to make patient comfort care only and [facility] as she was appropriate for same. Family was in agreement. The patient had significant decline and progressive metastatic disease. Oncologist was also in agreement, and [facility] in fair condition.   PHYSICAL EXAMINATION:  VITAL SIGNS: On the date of discharge, her vital signs are as follows: Temperature 97.6, heart rate 82 per minute, respirations 20 per minute, blood pressure 105/69 mmHg. She was saturating 96% on room air.  Pertinent Physical Examination on the date of discharge:   CARDIOVASCULAR: S1, S2 normal. No murmurs, rales, or gallop.  LUNGS: Decreased breath sounds at the bases. No wheezing, rales, rhonchi, or crepitation.  ABDOMEN: Soft, benign.  NEUROLOGIC: Alert, but pleasantly confused.   All other physical examination remained at baseline.   DISCHARGE MEDICATIONS:  1. Zofran 4 mg p.o. every 6 hours as needed.  2. Lorazepam 0.5 mg 1 to 2 tablets p.o./sublingual every 2 to 4 hours as needed.  3. Morphine 20 mg per mL, 5 mL p.o. every 1 to 2 hours.  4. Oxycodone 20 mg per mL, 5 mL every 1 to 2 hours as needed.   DISCHARGE DIET: As tolerated.   DISCHARGE ACTIVITY: As tolerated.   DISCHARGE INSTRUCTIONS AND FOLLOW-UP: Medications to be crushed or use liquid when appropriate. May change to rectal route if unable to swallow. Two liters oxygen via nasal cannula continuous and as needed. Please leave Foley catheter indwelling for urinary incontinence/prevention of skin breakdown.   CODE STATUS: DO NOT RESUSCITATE.   TOTAL TIME DISCHARGING THIS PATIENT: 45 minutes.     ____________________________ Ellamae SiaVipul S. Sherryll BurgerShah, MD vss:JT D: 05/03/2014 22:48:03 ET T: 05/03/2014 23:59:35 ET JOB#: [id number]  cc: Jerrin Recore S. Sherryll BurgerShah, MD, <Dictator> Richard L. Sullivan LoneGilbert, MD Gerome SamJanak K. Doylene Canninghoksi, MD Ned GraceNancy Phifer, MD Ellamae SiaVIPUL S [facility] MD ELECTRONICALLY SIGNED 05/07/2014 22:41
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
## 2015-01-05 NOTE — Consult Note (Signed)
Diagnosis:  Chief Complaint/Diagnosis     Subjective: Chief Complaint/Diagnosis:   1. Abnormal CT scan with necrotic mediastinal mass and splenic mass and possible adenopathy and upper abdominal area(June 23, 2013)Biopsy from Right supraclavicular lymph node is positive for diffuse large cell lymphoma.  Diagnosis in October of 2014Patient is starting bendamustine and Rituxan from July 18, 2013. Patient has finished total of 8 cycles of chemotherapy with  BENDAMUSTINE  AND RITUXAMAB.  On May of 2015.scan shows improvement but still persistent significant diseasepoor mental status over last 3 weeks     HPI   Patient was admitted in the hospital with altered mental status.  Had a previous admission with what was suspected to be stroke.  Today CT scan shows mass with vasogenic edema and midline shift.has been admitted in the hospital for of intravenous steroid and radiation therapy.  Condition has been declining for a while with gradually becoming more and more confused disoriented.  For last 48 hours patient could not walk.  Could not talk.  Became extremely confused and was brought the emergency room.is feeling better today.  Much more alert and oriented.  No headache.  No dizziness. Started on steroids and improved. Asked by Dr. Doylene Canninghoksi to see patient regarding whole head radiation  Past, Family and Social History:  Additional Past Medical and Surgical History Has   been reviewed from multiple previous notes   Allergies:   Other -Explain in Comment: N/V/Diarrhea  Review of Systems:  Performance Status (ECOG) 2   Neurological see HPI   Review of Systems   see present illness and nurses notes for detail  Physical Exam:  General/Skin/HEENT:  Eyes normal   ENMT normal   Head and Neck normal   Breasts/Resp/CV/GI/GU:  Breasts not examined   Respiratory and Thorax normal   Cardiovascular normal   Gastrointestinal not examined   Genitourinary not examined    MS/Neuro/Psych/Lymph:  Musculoskeletal not examined   Physical Exam [name] in a wheelchair. pleasant and alert. Appears to answer questions but aided by daughter   Relevent Results:   Relevant Scans and Labs CT shows extensive vasogenic edema with2.1 and 1.7 ring like lesions with midline shift of 1 cm   Assessment and Plan: Impression:   1.altered  mental status in this patient who is 81 year old had a previous history of aggressive lymphoma. Previous MRI scan and recent CT scan has been compared and shows progressive left upper lobe lesion with vasogenic edema and midline shift but improved in hospital on steroids most likely represents lymphomatous involvement however other possibility cannot be ruled outhad prolonged discussion with patient's daughter and explained findings. She is upset that there has been a delay in diagnosis and that Dr. Doylene Canninghoksi had not been aware of her status until now. Daughter is willling for her mother to proceed with radiation. i explained side effects of treatment and alternative forms of treatment to both of them. patient has limited comprehension and defers to her daughter. Plan:   Gen. status has improvedproceed with emergent radiation today. patient will undergo simulation, treatment planning and first treatment today. Plan 2500 cGy in 10 treatments.  Electronic Signatures: Jane Cross, Jane Cross (MD)  (Signed 29-Jul-15 11:54)  Authored: Diagnosis, PFSH, Allergies, ROS, Physical Exam, Relevent Results, Encounter Assessment and Plan   Last Updated: 29-Jul-15 11:54 by Jane Cross, Jane Cross (MD)

## 2015-01-05 NOTE — Discharge Summary (Signed)
Dates of Admission and Diagnosis:  Date of Admission 10-Apr-2014   Date of Discharge 14-Apr-2014   Admitting Diagnosis altered mental status   Final Diagnosis CNS lymphoma   Discharge Diagnosis 1 history of high-grade lymphoma involving mediastinum and upper abdominal area, diffuse large B-cell lymphoma status post chemotherapy    Chief Complaint/History of Present Illness Subjective: Chief Complaint/Diagnosis:  1. Abnormal CT scan with necrotic mediastinal mass and splenic mass and possible adenopathy and upper abdominal area(June 23, 2013) 2. Biopsy from Right supraclavicular lymph node is positive for diffuse large cell lymphoma.  Diagnosis in October of 2014 3. Patient is starting bendamustine and Rituxan from July 18, 2013. 4 Patient has finished total of 8 cycles of chemotherapy with  BENDAMUSTINE  AND RITUXAMAB.  On May of 2015. PET scan shows improvement but still persistent significant disease   Chief Complaint/History of Present Illness cont'd HPI:   ?? HPI  Patient was admitted in the hospital with altered mental status.  Had a previous admission with what was suspected to be stroke.  Today CT scan shows mass with vasogenic edema and midline shift. Patient has been admitted in the hospital for possibility of intravenous steroid and radiation therapy.  patient's  Condition has been declining for a while with gradually becoming more and more confused disoriented.  For last 48 hours patient could not walk.  Could not talk.  Became extremely confused and was brought the emergency room   Allergies:  Other -Explain in Comment: N/V/Diarrhea  Routine Chem:  30-Jul-15 06:08   Glucose, Serum  158  BUN  37  Creatinine (comp) 1.06  Sodium, Serum 139  Potassium, Serum 3.7  Chloride, Serum 105  CO2, Serum 26  Calcium (Total), Serum  8.3  Anion Gap 8  Osmolality (calc) 290  eGFR (African American)  55  eGFR (Non-African American)  48 (eGFR values <3mL/min/1.73 m2 may  be an indication of chronic kidney disease (CKD). Calculated eGFR is useful in patients with stable renal function. The eGFR calculation will not be reliable in acutely ill patients when serum creatinine is changing rapidly. It is not useful in  patients on dialysis. The eGFR calculation may not be applicable to patients at the low and high extremes of body sizes, pregnant women, and vegetarians.)  Result Comment LABS - This specimen was collected through an   - indwelling catheter or arterial line.  - A minimum of 7mls of blood was wasted prior    - to collecting the sample.  Interpret  - results with caution.  Result(s) reported on 12 Apr 2014 at 06:38AM.  Magnesium, Serum  2.7 (1.8-2.4 THERAPEUTIC RANGE: 4-7 mg/dL TOXIC: > 10 mg/dL  -----------------------)   PERTINENT RADIOLOGY STUDIES: LabUnknown:    28-Jul-15 13:54, CT Head Without Contrast  PACS Image   CT:  CT Head Without Contrast   REASON FOR EXAM:    ams  COMMENTS:       PROCEDURE: CT  - CT HEAD WITHOUT CONTRAST  - Apr 10 2014  1:54PM     CLINICAL DATA:  Altered mental status.  History of lymphoma.    EXAM:  CT HEAD WITHOUT CONTRAST    TECHNIQUE:  Contiguous axial images were obtained from the base of the skull  through the vertex without intravenous contrast.    COMPARISON:  03/21/2014 MR. 03/20/2014 CT.  FINDINGS:  Marked change since prior examination. Marked amount of vasogenic  edema involving the left temporal lobe, left operculum region, left  subinsular region,  left basal ganglia and posterior left frontal  -parietal lobe. Within this vasogenic edema, 2.1 and 1.7 cm  ring-like lesions suspected. This may represent tumor or abscess.  Contrast-enhanced MR would prove helpful for further delineation.    Marked mass effect upon the left lateral ventricle with 1 cm of  midline shift to the right. No current evidence of trapping of the  right lateral ventricle.    No skull fracture or intracranial  hemorrhage.    No CT evidence of large acute thrombotic infarct.   IMPRESSION:  Marked change since prior examination. Marked amount of vasogenic  edema involving the left temporal lobe, left operculum region, left  subinsular region, left basal ganglia and posterior left frontal  -parietal lobe. Within this vasogenic edema, 2.1 and 1.7 cm  ring-like lesions suspected. This may represent tumor or abscess.  Contrast-enhanced MR would prove helpful for further delineation.  Herpes not excluded.    Marked mass effect upon the left lateral ventricle with 1 cm of  midline shift to the right.    These results were called by telephone at the time of interpretation  on 04/10/2014 at 1:58 Pm to [facility] PA, who verbally  acknowledged these results.  Electronically Signed    By: Chauncey Cruel M.D.    On: 04/10/2014 14:02         Verified By: Doug Sou, M.D.,   Pertinent Past History:  Pertinent Past History PFSH: ?? Additional Past Medical and Surgical History Significant History/PMH: ??  thyroid ca:  ??  osteoporosis:  ??  arthritis:  ??  GERD:  ??  depression:  ??  anxiety:  ??  kidney stones:  ??  cataracts:  ??  skin cancer:  ??  Hypercholesterolemia:  ??  Hypertension:   Preventive [place] Hospital Course:  Hospital Course During hospital stay patient was started on IV Decadron and Keppra to prevent seizure activity.  Also started on radiation therapy to the whole brain.  And physiotherapy.  Gradually patient's condition improved.  Patient started walking with the help of walker.  Became much more alert and oriented.  Overall prognosis has been poor and lliving will   issue had been discussed with the family. Patient is now being [facility] to a [facility] for [facility] and will continue radiation therapy   Condition on Discharge Guarded   Code Status:  Code Status No Code/Do Not Resuscitate   DISCHARGE INSTRUCTIONS HOME MEDS:  Medication  Reconciliation: Patient's Home Medications at Discharge:     Medication Instructions  vitamin b12 100 mcg oral tablet  1 tab(s) orally once a day   paroxetine 20 mg oral tablet  1 tab(s) orally once a day   magnesium oxide 400 mg oral tablet  1 tab(s) orally 2 times a day   vitamin d3 1000 intl units oral capsule  1 cap(s) orally once a day   levothyroxine 25 mcg (0.025 mg) oral tablet  1 tab(s) orally once a day   lisinopril 20 mg oral tablet  1 tab(s) orally once a day (in the morning)   hydrochlorothiazide 25 mg oral tablet  1 tab(s) orally once a day   preservision areds 2 antioxidant multiple vitamins and minerals oral capsule  1 cap(s) orally once a day   fluticasone nasal 50 mcg/inh nasal spray  1 spray(s) nasal 2 times a day   genteal preserved ophthalmic solution  1 drop(s) to each affected eye 2 times a day, As Needed  norco 325 mg-5 mg oral tablet   1/2 to 1 tab orally every 4 hrs  -for Pain   oxazepam 15 mg oral capsule   orally 2 times a day, As Needed anxiety   folic acid 0.8 mg oral tablet  1 tab(s) orally once a day   omeprazole   orally once a day   levetiracetam 500 mg oral tablet  1 tab(s) orally every 12 hours   dexamethasone 4 mg oral tablet  4 tab(s) orally once daily x 4 daystabs orally once daily x 4 daystabs orally once daily x 4 daystab orally once daily x 4 days     PRESCRIPTIONS: ELECTRONICALLY SUBMITTED  STOP TAKING THE FOLLOWING MEDICATION(S):    meclizine 25 mg oral tablet: 1 tab(s) orally once a day (at bedtime) eucerin - topical cream: Apply topically to affected area once a day ultram 50 mg oral tablet: 25 milligram(s) orally 1-2 tabs every 4 hours as needed for pain. acetaminophen 325 mg oral tablet: 2 tab(s) orally every 4 hours, As needed, pain or temp. greater than 100.4 atorvastatin 40 mg oral tablet: 1 tab(s) orally once a day aspirin 81 mg oral delayed release tablet: 1 tab(s) orally once a day  Physician's Instructions:  Home Health? No    Treatments continue radiation therapy   Home Oxygen? No   Diet Regular   Diet Consistency Regular Consistency   Activity Limitations As tolerated   Return to Work Not Applicable   Time frame for Follow Up Appointment 1-2 weeks   Other Comments please call cancer Center radiation department and will continue radiation treatment starting on Monday.     Miguel Aschoff L(Family Physician): [facility], [address], [place], [place], [place] [address], [place] [phone]  Electronic Signatures: Jobe Gibbon (MD)  (Signed 31-Jul-15 12:29)  Authored: ADMISSION DATE AND DIAGNOSIS, CHIEF COMPLAINT/HPI, Allergies, PERTINENT LABS, PERTINENT RADIOLOGY STUDIES, PERTINENT PAST HISTORY, HOSPITAL COURSE, [place], PATIENT INSTRUCTIONS, Follow Up Physician   Last Updated: 31-Jul-15 12:29 by Jobe Gibbon (MD)

## 2015-01-05 NOTE — Consult Note (Signed)
PATIENT NAME:  Jane PizzaWHITESELL, Syanne W MR#:  [id number] DATE OF BIRTH:  [birth year]  DATE OF CONSULTATION:  03/21/2014  REFERRING PHYSICIAN:   CONSULTING PHYSICIAN:  Pauletta BrownsYuriy Abel Ra, MD  HISTORY OF PRESENT ILLNESS:  This is an 81 year old female with past medical history of diffuse large cell lymphoma, hypertension, hyperlipidemia, and vocal cord paralysis being seen by ENT for questionable treatment for her lymphoma about three weeks ago, presenting with what appears to be dysarthria/dysphagia. The patient worked up with a CAT scan of the head showed wedged-shaped left temporal parietal infarct with minimal edema around it, was started on Decadron.   PAST MEDICAL HISTORY: Thyroid carcinoma, osteoporosis, arthritis, GERD, depression, anxiety, nephrolithiasis, cataracts, skin cancer, hypercholesteremia, hypertension, diffuse large cell lymphoma and vocal cord paralysis.   SOCIAL HISTORY: The patient does not smoke. No alcohol, no illicit drug use.   FAMILY HISTORY: No family history on file, but the patient states there was colorectal cancer.   REVIEW OF SYSTEMS: Unable to obtain that through current medical conditions.   HOME MEDICATIONS: Neurontin, Zofran, meclizine, hydrochlorothiazide, lisinopril, levothyroxine, fludrocortisone, Ultram, folic acid, vitamin B12, magnesium. and vitamin D,   ALLERGIES: NO KNOWN DRUG ALLERGIES.   PHYSICAL EXAMINATION:   VITAL SIGNS: The patient's temperature is 97.3, pulse 85, respirations 18, blood pressure 153/81, pulse oximetry 96%.  NEUROLOGIC: The patient is alert to her name and date of birth only. Could not tell me the President [place]. Could not tell me her current location, Speech appears to be dysarthric with mild aphasia noted as well. Extraocular movements intact. Pupils 3-2, reactive bilaterally. Facial sensation intact. Facial motor is intact. Tongue is midline. Uvula elevates symmetrically. Shoulder shrug intact. Motor strength 4+/5  bilateral upper extremity, 3/5 bilateral lower extremities. Sensation intact to light touch and temperature. Coordination, could not assess. Gait could not be assessed. Reflexes are symmetrical.   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  Based on reviewing CAT scan, there is minimal edema. The patient was started on Decadron.  On evaluation, not completely convinced this is an ischemic infarct because it does not follow the usual vascular territories were infarct would be. Agree with MRI.   PLAN: MRI of head with and without that is ordered.  Would discontinue her Decadron, the edema is minimal. We are not convinced this is a mass and there is no role or treatment of possible ischemic event with steroids. Physical therapy and occupational therapy. I think will have a better understanding of her condition after the MRI is done with and without contrast.   Thank you. It was a pleasure seeing this patient.    ____________________________ Pauletta BrownsYuriy Marytza Grandpre, MD yz:ts D: 03/21/2014 14:52:18 ET T: 03/21/2014 16:38:37 ET JOB#: [id number]  cc: Pauletta BrownsYuriy Farid Grigorian, MD, <Dictator> Pauletta BrownsYURIY Bianey Tesoro MD ELECTRONICALLY SIGNED 04/17/2014 21:10

## 2015-01-05 NOTE — Discharge Summary (Signed)
PATIENT NAME:  Jane PizzaWHITESELL, Saisha W MR#:  [id number] DATE OF BIRTH:  1925/07/15  DATE OF ADMISSION:  03/20/2014 DATE OF DISCHARGE:  03/22/2014  REASON FOR ADMISSION: Dysarthria.   EARS, NOSE, AND THROAT DOCTOR: Dr. Andee PolesVaught.   PRIMARY ONCOLOGIST: Dr. Doylene Canninghoksi.   PRIMARY CARE PHYSICIAN: Dr. Sullivan LoneGilbert.   DISCHARGE DIAGNOSES:  1.  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 Edema at the level of the atrium of the lateral ventricle.  2.  Hypertension.  3.  Dysarthria.  4.  Aphasia.  5.  Chronic anemia.  6.  Hypothyroidism.  7.  History of large cell lymphoma.  8.  Vocal cord palsy.  9.  Thyroid cancer.   DISPOSITION: Home.   MEDICATIONS AT DISCHARGE:  1.  Vitamin B12 100 mcg once daily. 2.  Paroxetine  20 mg once daily. 3.  Magnesium oxide 400 mg twice daily. 4.  Vitamin D 3000 mg daily. 5.  Levothyroxine 25 mcg daily. 6.  Lisinopril 20 mg daily. 7.  Hydrochlorothiazide 25 mg daily. 8.  Meclizine 25 mg daily.  9.  PreserVision as needed. 10.  Eucerin cream as needed. 11.  Dexilant 60 mg once a day. 12.  Fluticasone nasal spray 50 mcg twice daily. 13.  Ultram 50 mg take 1/2 of tablet to 1- 2 every 4 hours as needed for pain.  14.  Tylenol 325 mg 2 tablets every 4 hours as needed for pain. 15.  Atorvastatin 40 mg once a day. This is a new prescription. 16.  Aspirin 81 mg once a day, also a new prescription. 17.  They stopped gabapentin, stopped pravastatin.   FOLLOW-UP:   1.  Julieanne Mansonichard Gilbert, MD, in 7-14 days.  Dr. Sullivan LoneGilbert please arrange outpatient Holter monitoring for the patient and also repeat the MRI within the next 8 to 12 weeks for evaluation of possible mass versus evolution of a stroke.   2.  Dr. Doylene Canninghoksi for continuation of treatment of her lymphoma.  3.  Dr. Andee PolesVaught, ENT in the next 2-4 weeks as well.   RESULTS: MRI as mentioned above.  CT scan shows findings most consistent with large left temporal parietal  stroke.  Chest x-ray: Stable changes related to lymphoma with right paratracheal mass and grossly stable right upper lobe nodular density. Her LDL is elevated at 126, total cholesterol 207, HDL 60, sodium 139, creatinine 0.98, hemoglobin 10.7. White count 4.9. Urinalysis, positive nitrates, but no other signs of urinary tract infection. Echo Doppler shows an ejection fraction of 70%-75%,  normal left ventricular function, positive diastolic dysfunction, mild increased ventricular septal thickness, aortic valve sclerosis without stenosis.   HOSPITAL COURSE: This is a very nice 81 year old female with history of lymphoma, recently treated by Dr. Doylene Canninghoksi, comes with also hypertension, hyperlipidemia, and vocal cord polyps.  He is seen in the ENT by Dr. Andee PolesVaught with some increase of dysarthria and dysphagia. The patient had a CAT scan that showed a wedge-shaped left temporal parietal  infarct with minimal edema. The patient was evaluated in the ER and admitted for treatment and evaluation of a stroke.   Neurology consultation was obtained as per Dr. Loretha BrasilZeylikman. He discontinued Decadron due to the minimal edema. He was not convinced that the mass was there, for which the MRI was done. The MRI showed perfusion defects that were more consistent with a stroke, although there was some edema on the area surrounding the stroke for which the possibility of metastasis cannot be excluded. Recommendation  is to repeat the MRI within the next 2 months. The patient is going to be discharged in good condition. She was not taking aspirin for which she is going to be an 81 mg daily. Her LDL cholesterol is 126. Her goal is less than 70 for what we are going to change her pravastatin to atorvastatin at 40 mg recheck her lipid profile in the next 2 months.   The patient had significant aphasia for which she is being evaluated by speech therapies. She has some cognition defects or deficit due to the stroke, for what we recommend home  health with speech therapy.   The patient, otherwise, does not have any significant physical findings of the stroke other than the speech. She is able to ambulate. She has good strength. The only problem is her cognition for safeness.   The patient also had hypertension and her blood pressure medications were held.  During her first 48 hours, her blood pressure ran in the 190s-150s, right now is 156/82. We will restart her home medications for blood pressure, hydrochlorothiazide and lisinopril.  Other than that, the patient did well during this hospitalization.   TIME SPENT: I spent about 40 minutes discharging this patient today.   ____________________________ Felipa Furnace, MD rsg:ts D: 03/22/2014 12:13:36 ET T: 03/22/2014 12:50:26 ET JOB#: [id number]  cc: Felipa Furnace, MD, <Dictator> Richard L. Sullivan Lone, MD Kyung Rudd, MD Gerome Sam. Doylene Canning, MD Regan Rakers Juanda Chance MD ELECTRONICALLY SIGNED 03/29/2014 23:03

## 2015-06-14 IMAGING — CT CT HEAD WITHOUT CONTRAST
1 of 2 series · 13 of 30 positions shown, 17 images · non-contrast
Comparison: 03/21/2014 MR. 03/20/2014 CT.

CLINICAL DATA: Altered mental status.  History of lymphoma.

EXAM:
CT HEAD WITHOUT CONTRAST
TECHNIQUE: Contiguous axial images were obtained from the base of the skull
through the vertex without intravenous contrast.

[Series 2: head wo · axial · 0.38mm/px · z∈[-172,-55]mm · 13 of 32 slices shown, 17 images]
[im 3/32  brain]
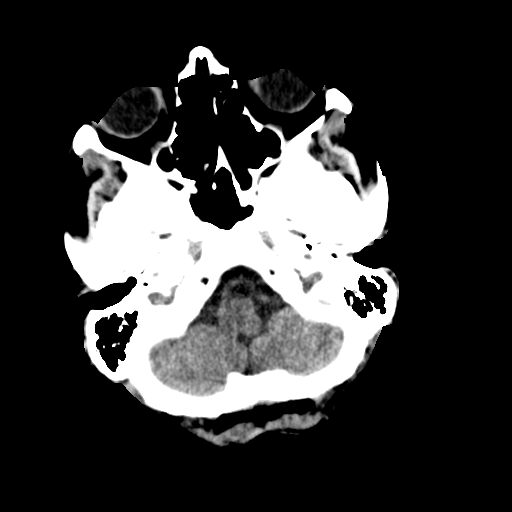
[im 3/32  bone]
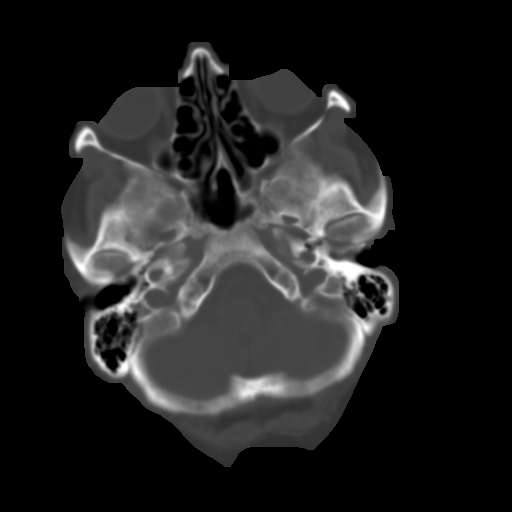
[im 5/32  brain]
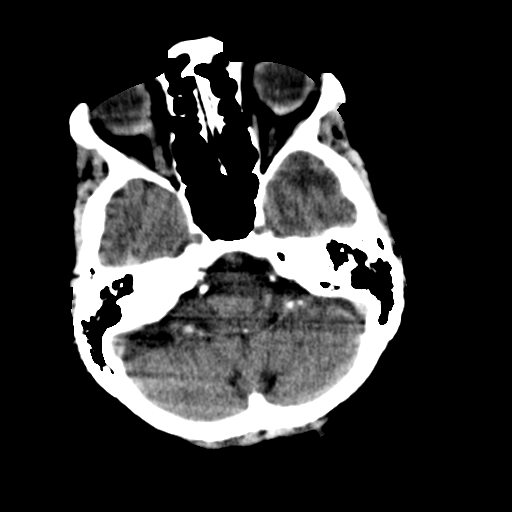
[im 7/32  brain]
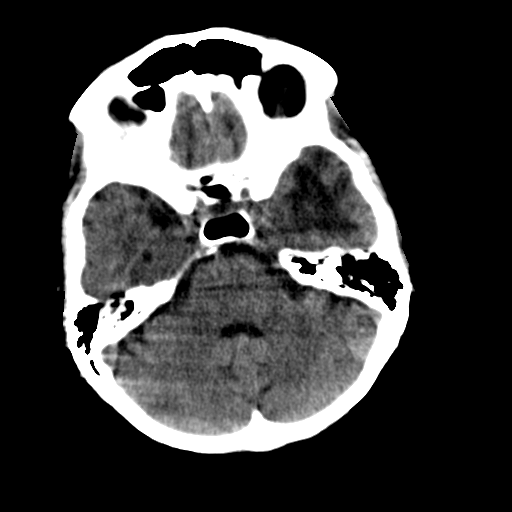
[im 9/32  brain]
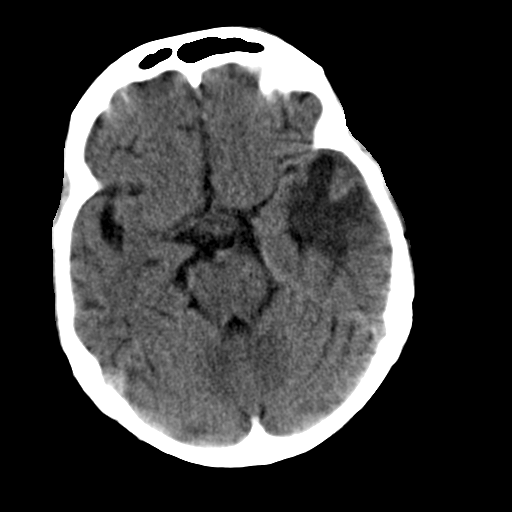
[im 12/32  brain]
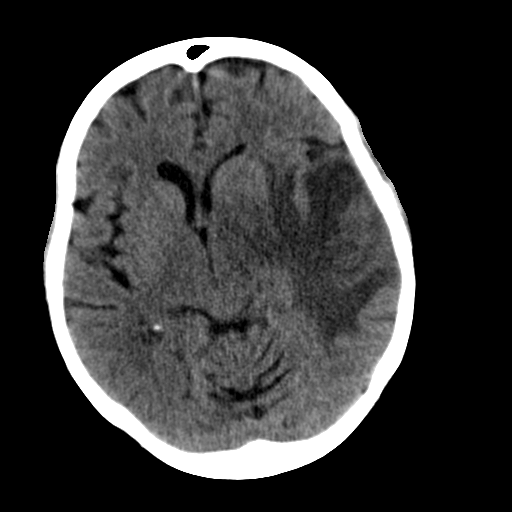
[im 12/32  bone]
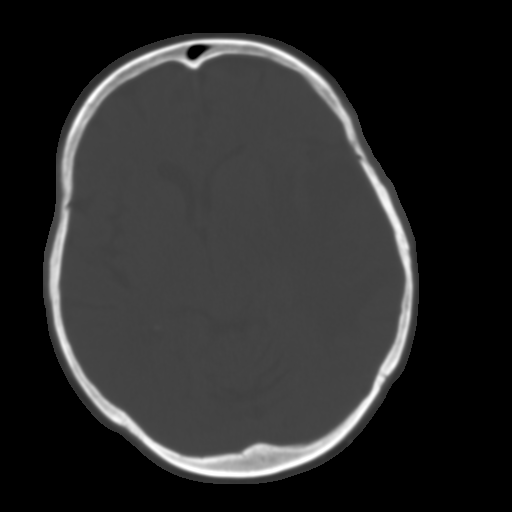
[im 14/32  brain]
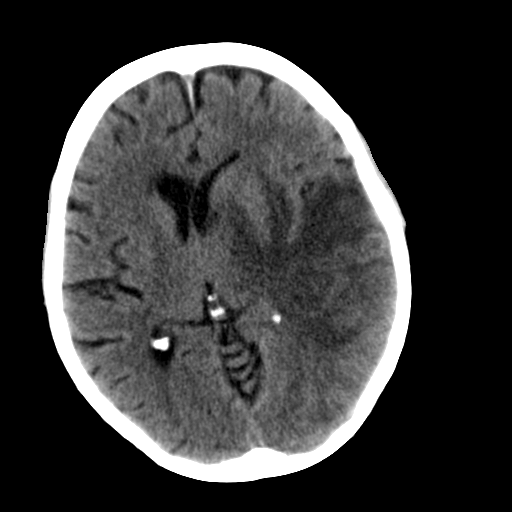
[im 16/32  brain]
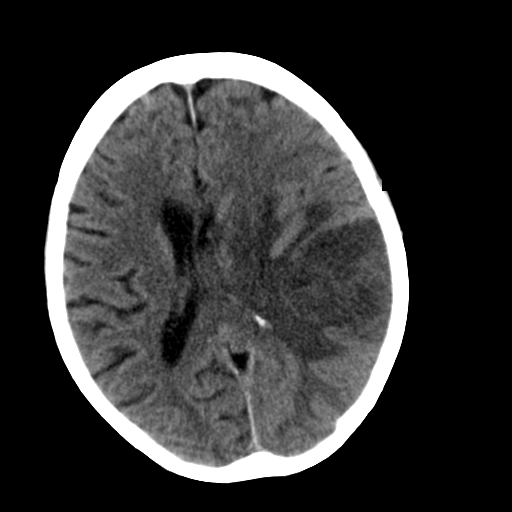
[im 18/32  brain]
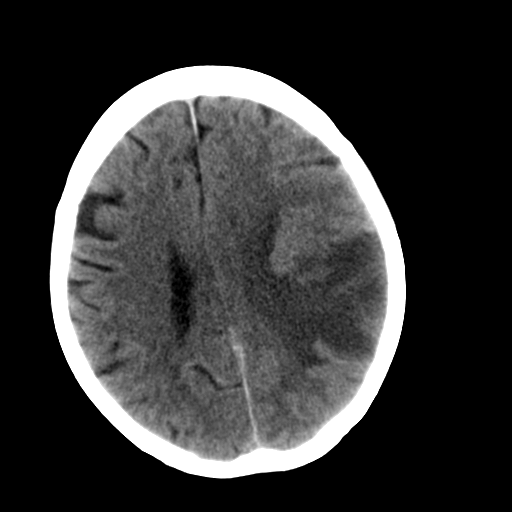
[im 20/32  brain]
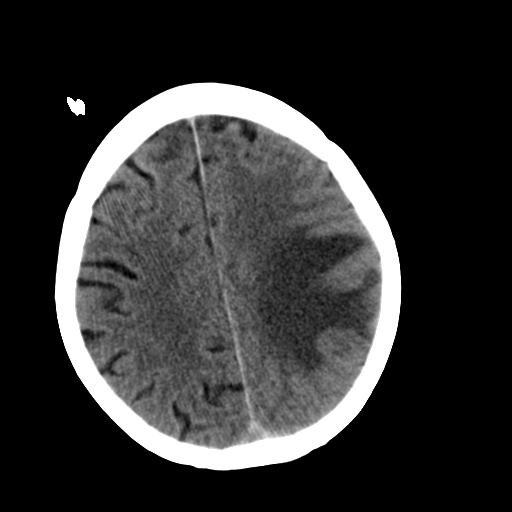
[im 20/32  bone]
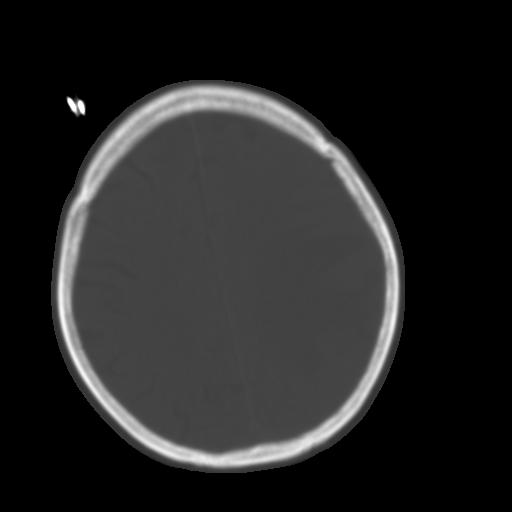
[im 23/32  brain]
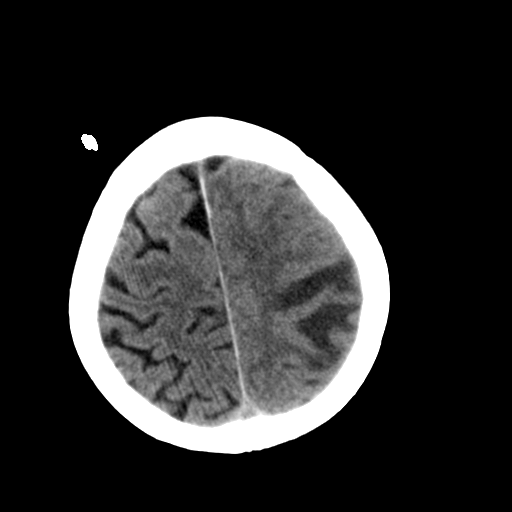
[im 25/32  brain]
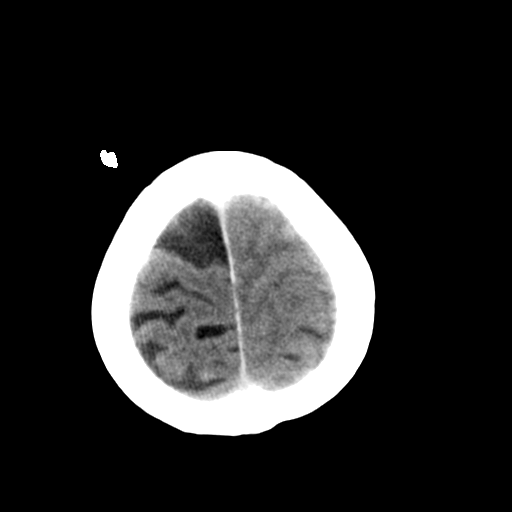
[im 27/32  brain]
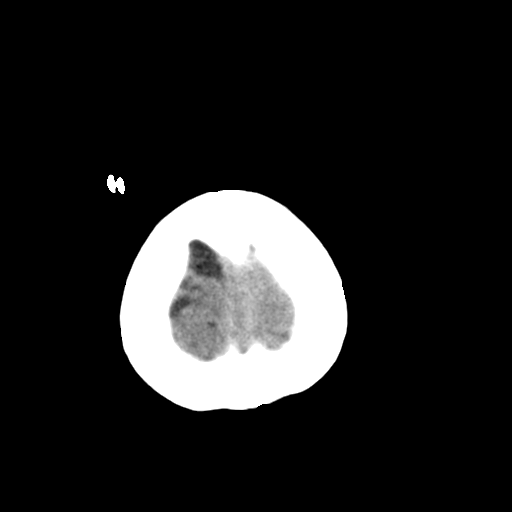
[im 29/32  brain]
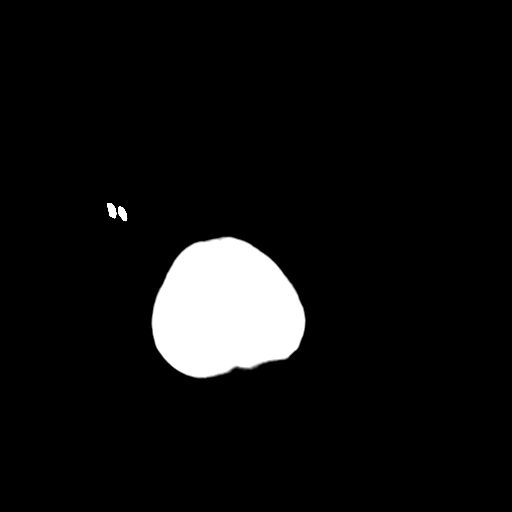
[im 29/32  bone]
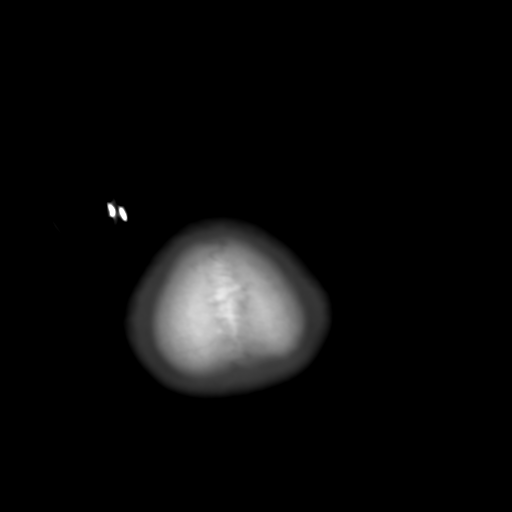

[13 of 30 positions shown; findings below may reference images not displayed]

FINDINGS: Marked change since prior examination. Marked amount of vasogenic
edema involving the left temporal lobe, left operculum region, left
subinsular region, left basal ganglia and posterior left frontal
-parietal lobe. Within this vasogenic edema, 2.1 and 1.7 cm
ring-like lesions suspected. This may represent tumor or abscess.
Contrast-enhanced MR would prove helpful for further delineation.

Marked mass effect upon the left lateral ventricle with 1 cm of
midline shift to the right. No current evidence of trapping of the
right lateral ventricle.

No skull fracture or intracranial hemorrhage.

No CT evidence of large acute thrombotic infarct.
IMPRESSION: Marked change since prior examination. Marked amount of vasogenic
edema involving the left temporal lobe, left operculum region, left
subinsular region, left basal ganglia and posterior left frontal
-parietal lobe. Within this vasogenic edema, 2.1 and 1.7 cm
ring-like lesions suspected. This may represent tumor or abscess.
Contrast-enhanced MR would prove helpful for further delineation.
Herpes not excluded.

Marked mass effect upon the left lateral ventricle with 1 cm of
midline shift to the right.

These results were called by telephone at the time of interpretation
on 04/10/2014 at [DATE] to KATHIANY FIGUEIRA PA, who verbally
acknowledged these results.

## 2015-06-14 IMAGING — DX DG CHEST 1V PORT
1 series · 1 of 1 positions shown · non-contrast
Comparison: 03/20/2014

CLINICAL DATA: Fall, right hip pain

EXAM:
PORTABLE CHEST - 1 VIEW

[chest]
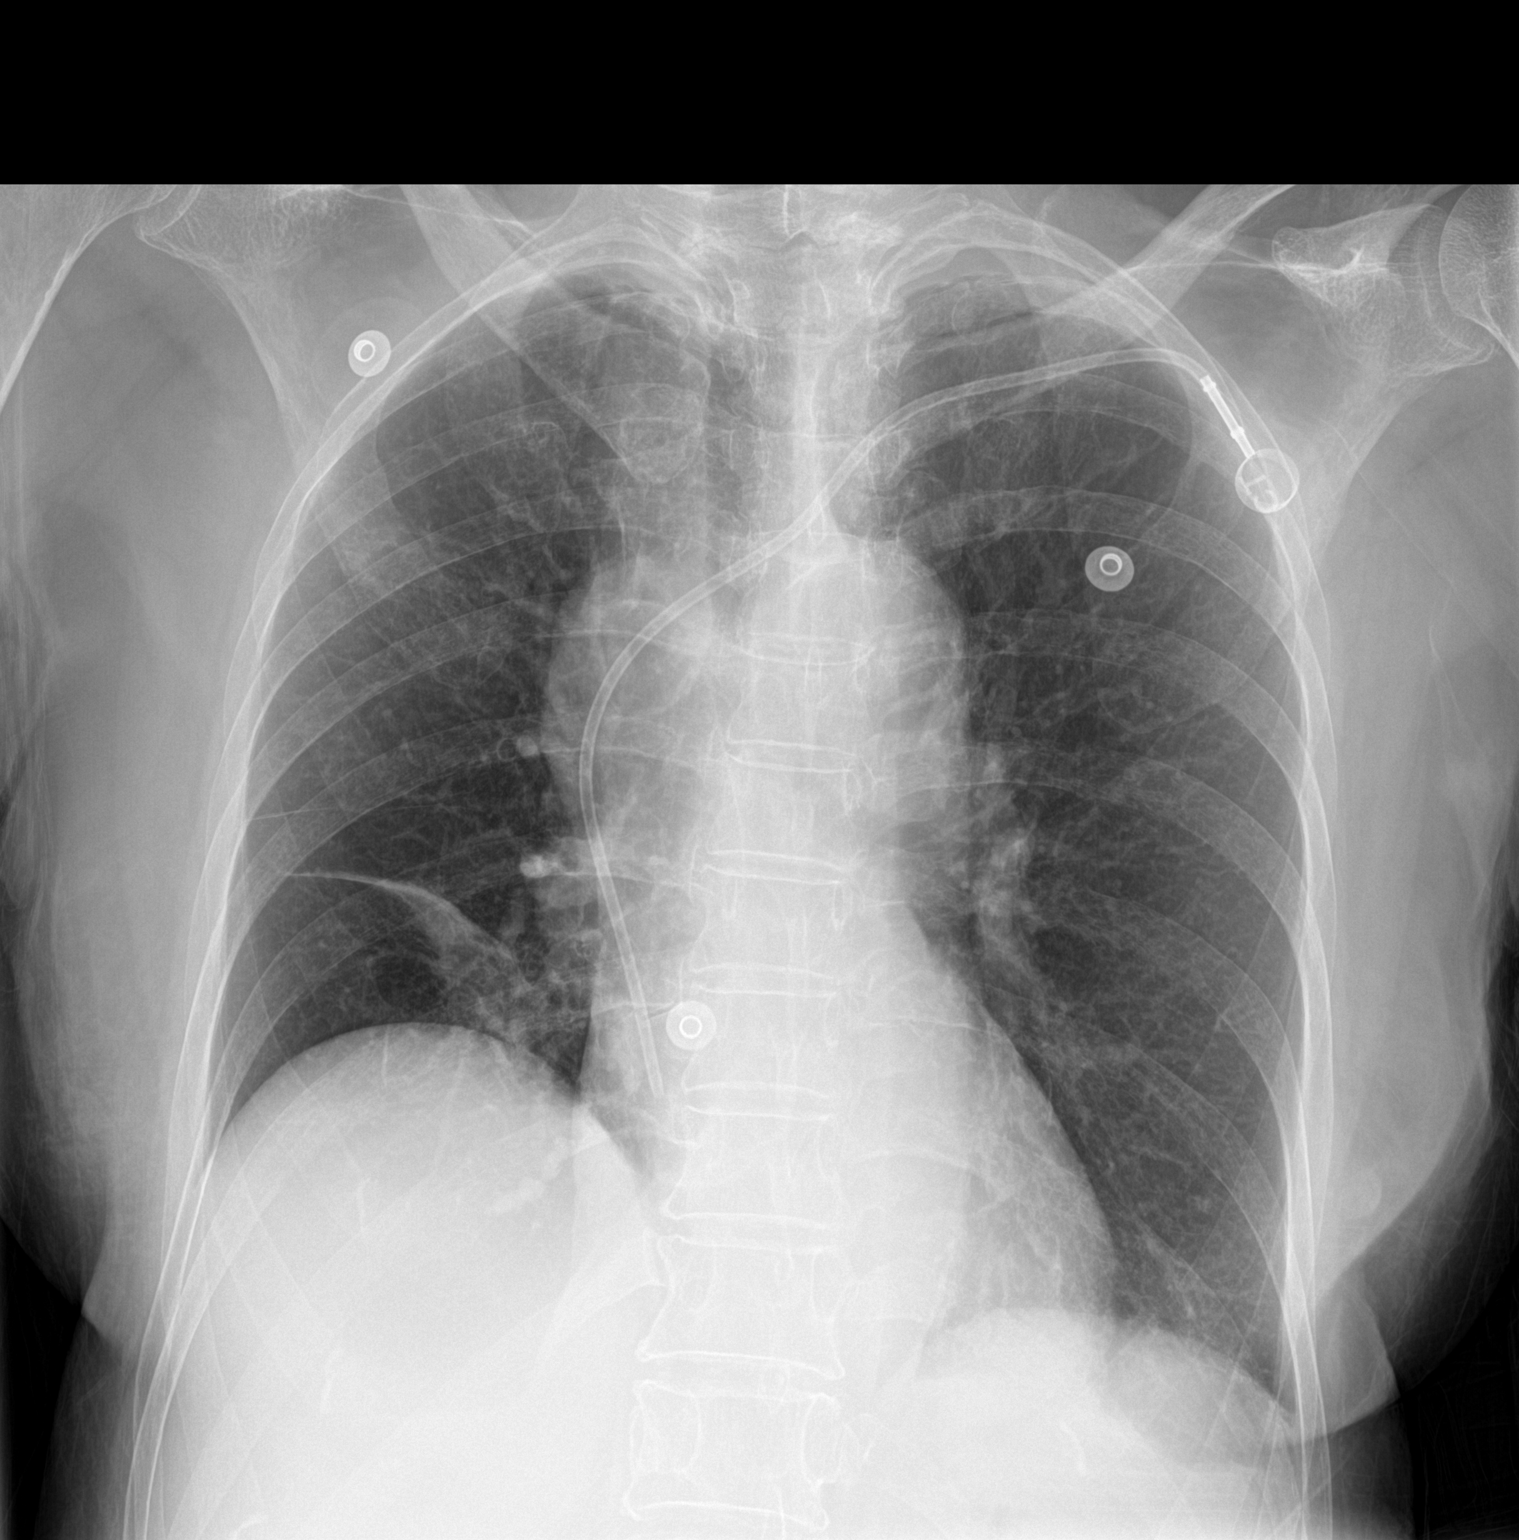

[1 of 1 positions shown; findings below may reference images not displayed]

FINDINGS: Cardiomediastinal silhouette is stable. Right paratracheal soft
tissue prominence is stable. Stable nodular density in right upper
lobe. Again noted linear atelectasis or scarring right base. Left
lung is clear. Left Port-A-Cath is unchanged is position. No
infiltrate or pulmonary edema.
IMPRESSION: No acute infiltrate or pulmonary edema. Stable right paratracheal
soft tissue prominence. Stable nodular density in right upper lobe.

## 2015-07-06 IMAGING — CR DG CHEST 1V PORT
1 series · 1 of 1 positions shown · non-contrast
Comparison: 04/10/2014.

CLINICAL DATA: Chest pain.

EXAM:
PORTABLE CHEST - 1 VIEW

[ap]
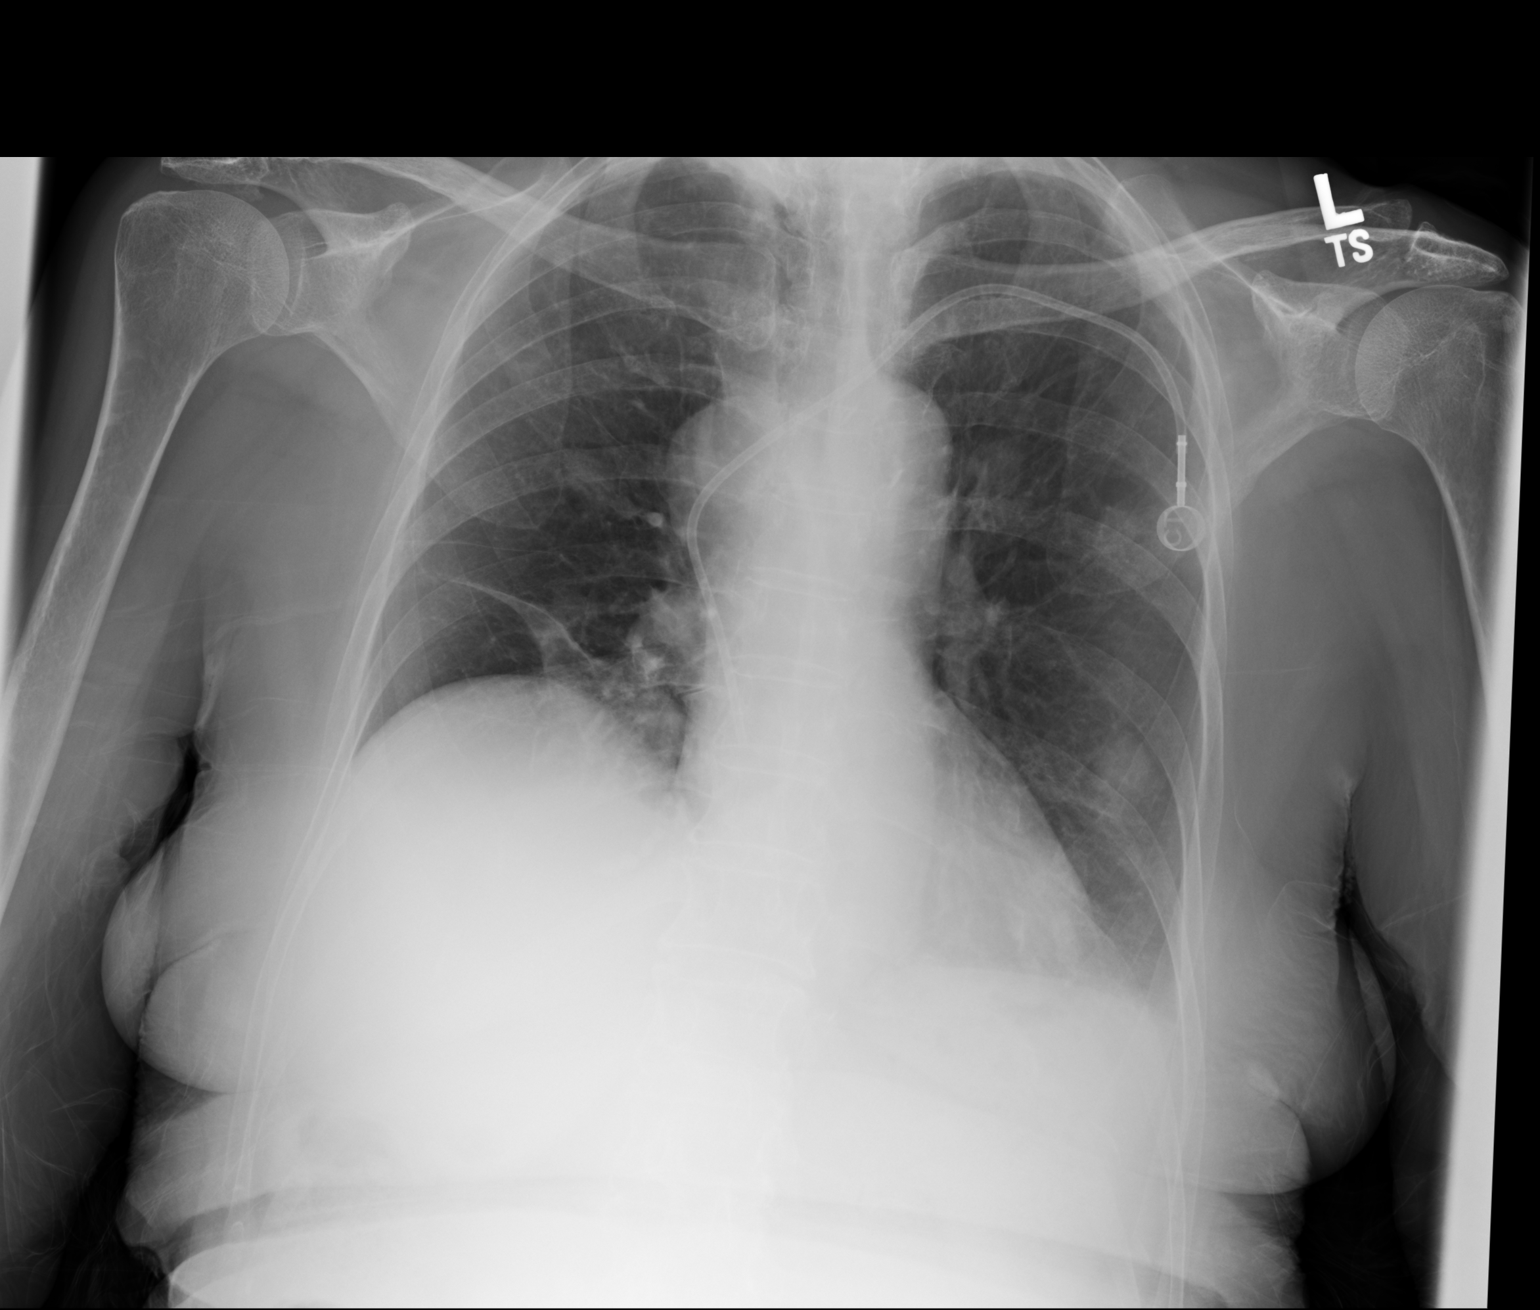

[1 of 1 positions shown; findings below may reference images not displayed]

FINDINGS: The power port is stable. The cardiac silhouette, mediastinal and
hilar contours are unchanged. There is a persistent right
paratracheal mass. There are also multiple pulmonary masses. These
appear to be new and are concerning for metastatic disease. Stable
right lower lobe atelectasis and elevation of the right
hemidiaphragm.
IMPRESSION: New pulmonary nodules suggesting metastatic disease.

Stable right paratracheal/mediastinal mass.
